# Patient Record
Sex: Female | Born: 1975 | Hispanic: No | Marital: Married | Smoking: Former smoker
Health system: Southern US, Community
[De-identification: ages and names within clinical notes are randomized; demographics above are authoritative.]

## PROBLEM LIST (undated history)

## (undated) DIAGNOSIS — E119 Type 2 diabetes mellitus without complications: Secondary | ICD-10-CM

## (undated) DIAGNOSIS — K219 Gastro-esophageal reflux disease without esophagitis: Secondary | ICD-10-CM

## (undated) DIAGNOSIS — G2581 Restless legs syndrome: Secondary | ICD-10-CM

## (undated) HISTORY — PX: ABDOMINAL HYSTERECTOMY: SHX81

## (undated) HISTORY — DX: Type 2 diabetes mellitus without complications: E11.9

## (undated) HISTORY — PX: CARDIAC CATHETERIZATION: SHX172

---

## 2004-07-07 ENCOUNTER — Ambulatory Visit: Payer: Self-pay | Admitting: Unknown Physician Specialty

## 2005-03-29 ENCOUNTER — Inpatient Hospital Stay: Payer: Self-pay | Admitting: General Surgery

## 2005-12-12 ENCOUNTER — Emergency Department: Payer: Self-pay | Admitting: Emergency Medicine

## 2006-03-21 ENCOUNTER — Emergency Department: Payer: Self-pay | Admitting: Unknown Physician Specialty

## 2006-03-23 ENCOUNTER — Emergency Department: Payer: Self-pay | Admitting: Emergency Medicine

## 2008-01-15 ENCOUNTER — Emergency Department: Payer: Self-pay | Admitting: Emergency Medicine

## 2008-02-02 ENCOUNTER — Emergency Department: Payer: Self-pay | Admitting: Emergency Medicine

## 2008-02-02 ENCOUNTER — Other Ambulatory Visit: Payer: Self-pay

## 2008-02-02 IMAGING — CT CT CHEST W/ CM
1 series · 15 of 33 positions shown, 19 images · IV contrast (APPLIED)
Comparison: none

REASON FOR EXAM: pain, chest into back into throat, tachycardia, eval for
PE
COMMENTS:

PROCEDURE:     CT  - CT CHEST (FOR PE) W  - [DATE]  [DATE]
RESULT:
HISTORY: Pain, tachycardia.

[Series 5: soft tissue · axial · 0.74mm/px · z∈[-342,-86]mm · 15 of 101 slices shown, 19 images]
[im 8/101  mediastinal]
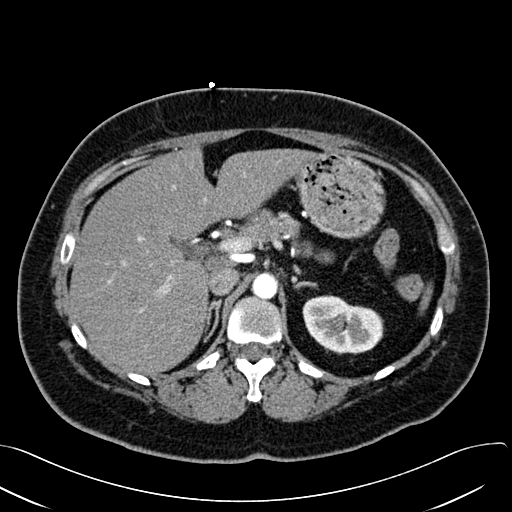
[im 8/101  lung]
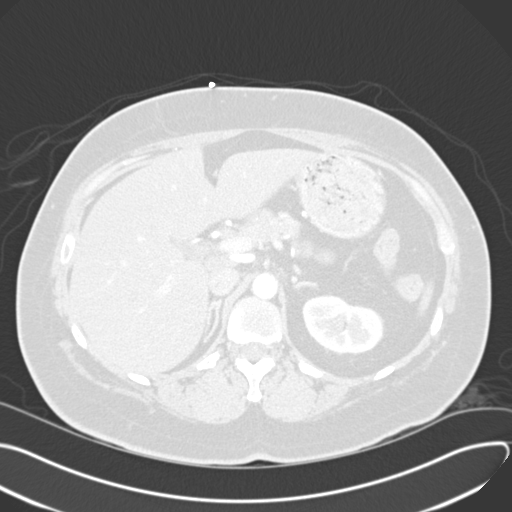
[im 15/101  lung]
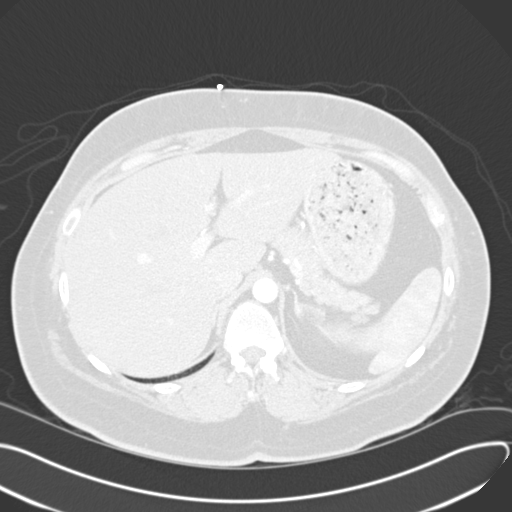
[im 21/101  lung]
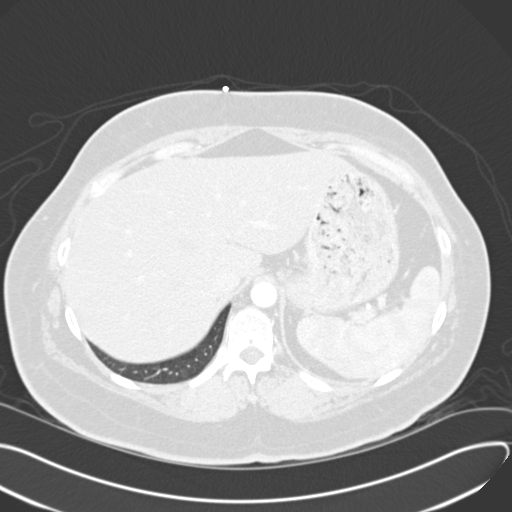
[im 26/101  lung]
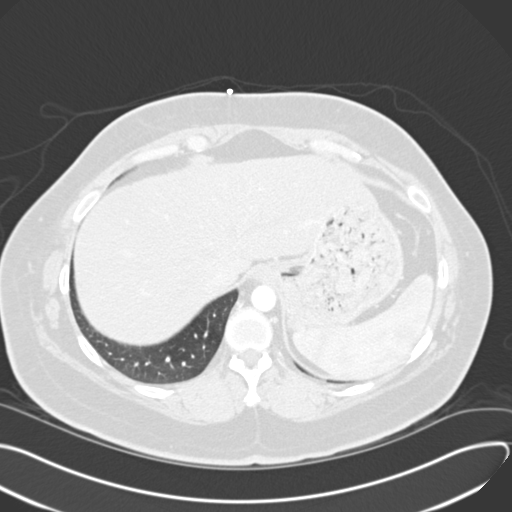
[im 34/101  mediastinal]
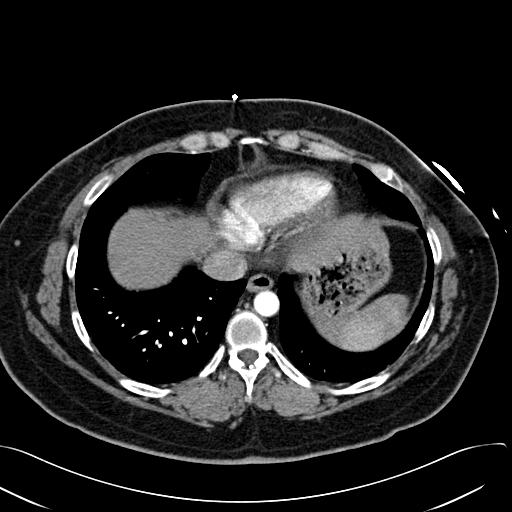
[im 34/101  lung]
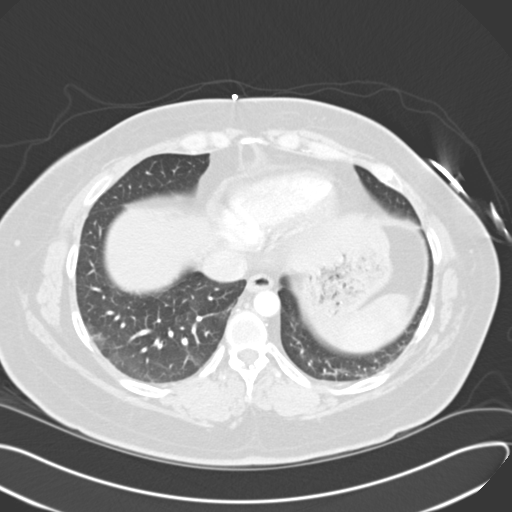
[im 41/101  lung]
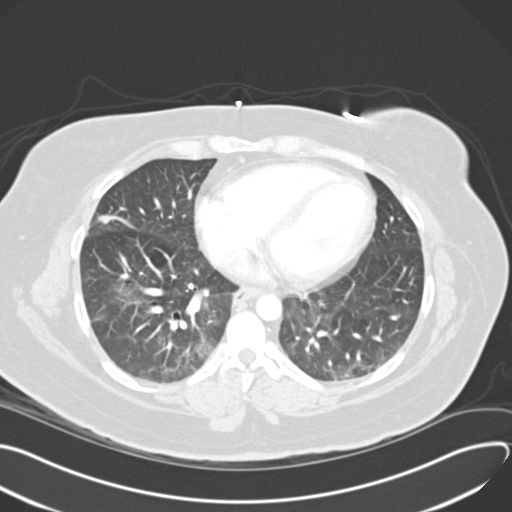
[im 48/101  lung]
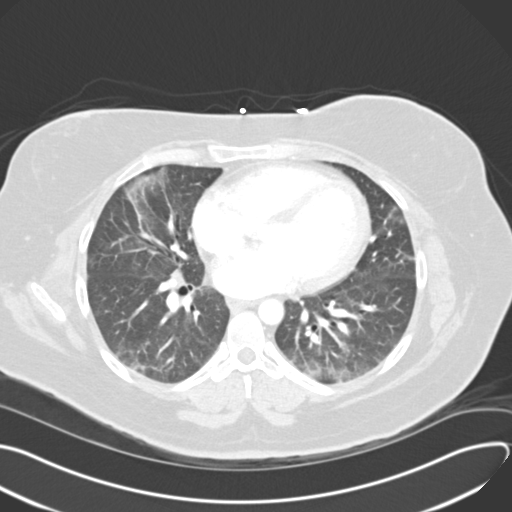
[im 52/101  lung]
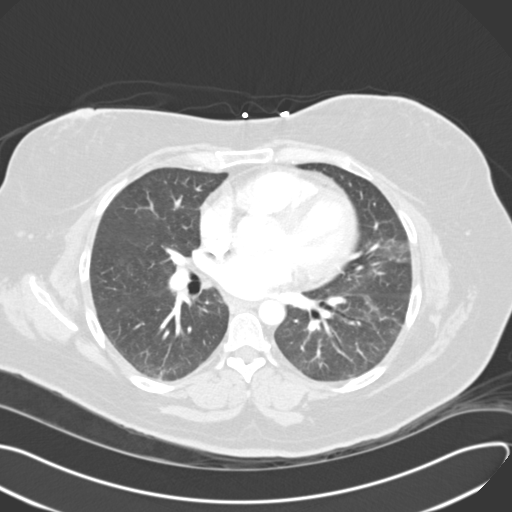
[im 56/101  mediastinal]
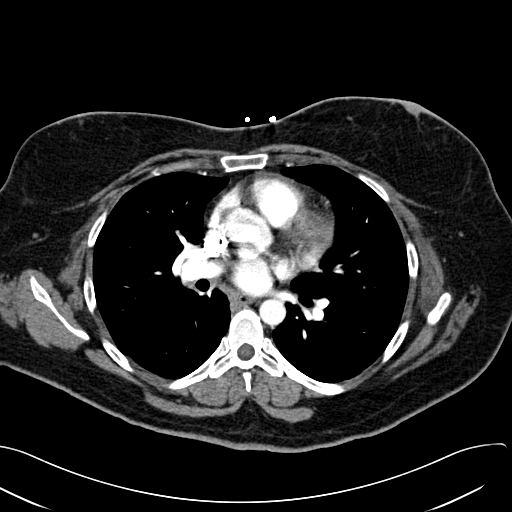
[im 56/101  lung]
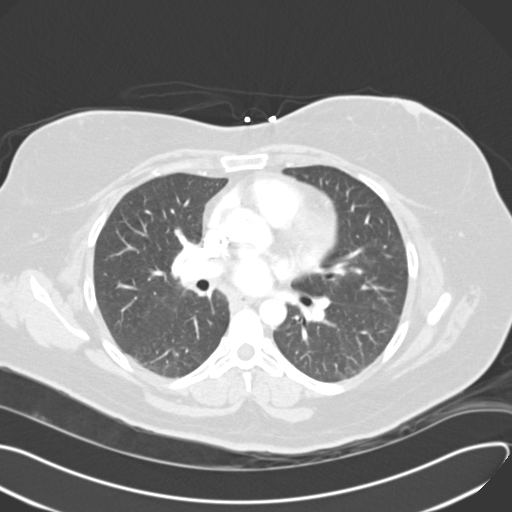
[im 61/101  lung]
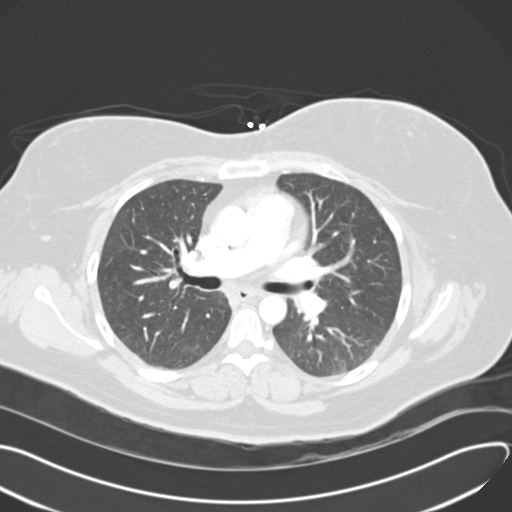
[im 67/101  lung]
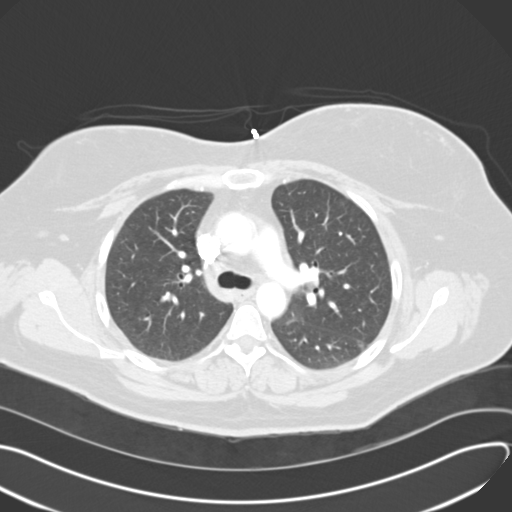
[im 75/101  lung]
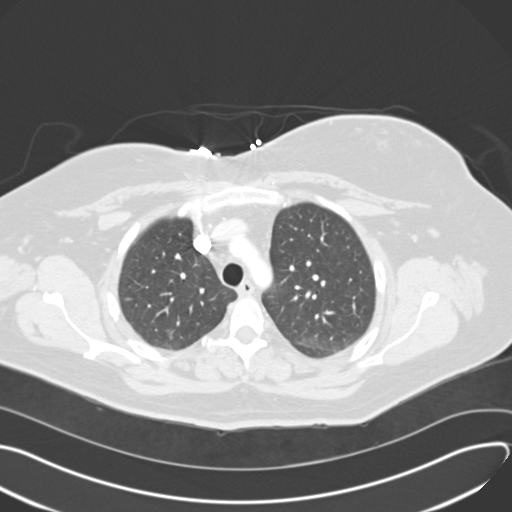
[im 81/101  mediastinal]
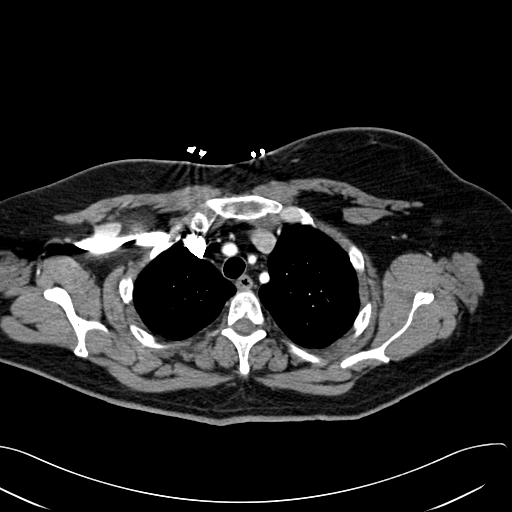
[im 81/101  lung]
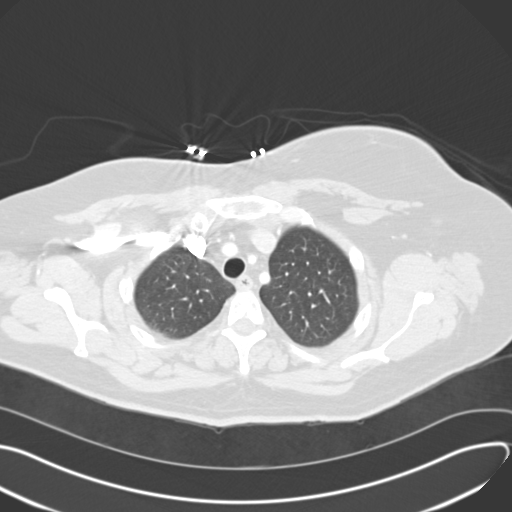
[im 86/101  lung]
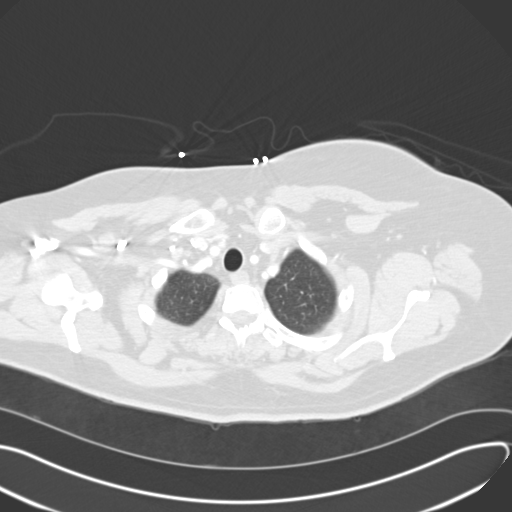
[im 93/101  lung]
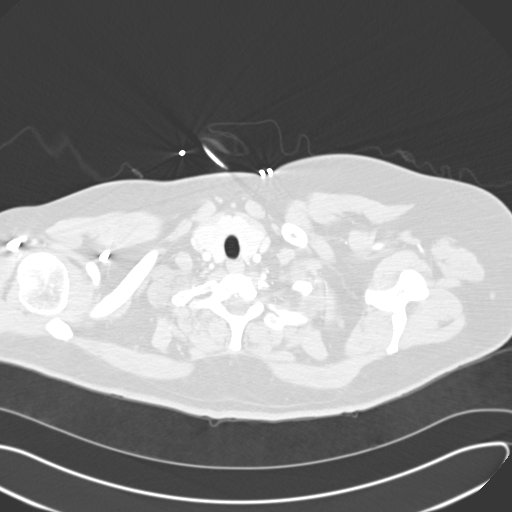

[15 of 33 positions shown; findings below may reference images not displayed]

COMPARISON STUDIES: No recent.

PROCEDURE AND FINDINGS:  Standard IV contrast enhanced Chest CT is obtained.
 The thoracic aorta is normal.  There is no evidence of pulmonary embolus.
Bilateral patchy pulmonary infiltrates are present. These can be seen with
pneumonia. There is no evidence of pleural effusion or cardiomegaly. Noted
is a pulmonary nodule measuring approximately 5 mm in the RIGHT upper lobe.
This is present on image #26.
IMPRESSION: 1.No pulmonary embolus.

2.RIGHT upper lobe pulmonary nodule measuring 5 mm. Close follow-up Chest
CTs are recommended to further evaluate. The nodule is not calcified and
follow-up images will be needed at 6 month intervals to ensure stability. If
there is interval change a PET CT may prove useful although at this time the
nodule is most likely too small for accurate evaluation by PET CT or by
percutaneous biopsy. Alternative would be surgical removal at this time.

3.Bibasilar infiltrates suggesting the possibility of bibasilar pneumonia or
other infiltrative process. Follow-up Chest CTs can be performed to
demonstrate clearing.

## 2008-10-08 ENCOUNTER — Emergency Department: Payer: Self-pay | Admitting: Emergency Medicine

## 2010-06-07 ENCOUNTER — Emergency Department: Payer: Self-pay | Admitting: Emergency Medicine

## 2014-07-29 ENCOUNTER — Ambulatory Visit: Payer: Self-pay | Admitting: Family Medicine

## 2015-06-02 ENCOUNTER — Encounter: Payer: Self-pay | Admitting: Emergency Medicine

## 2015-06-02 ENCOUNTER — Emergency Department
Admission: EM | Admit: 2015-06-02 | Discharge: 2015-06-02 | Disposition: A | Discharge status: Home / Self Care | Payer: 59 | Attending: Emergency Medicine | Admitting: Emergency Medicine

## 2015-06-02 ENCOUNTER — Emergency Department: Payer: 59

## 2015-06-02 DIAGNOSIS — Z880 Allergy status to penicillin: Secondary | ICD-10-CM | POA: Insufficient documentation

## 2015-06-02 DIAGNOSIS — K529 Noninfective gastroenteritis and colitis, unspecified: Secondary | ICD-10-CM | POA: Diagnosis not present

## 2015-06-02 DIAGNOSIS — Z87891 Personal history of nicotine dependence: Secondary | ICD-10-CM | POA: Insufficient documentation

## 2015-06-02 DIAGNOSIS — R197 Diarrhea, unspecified: Secondary | ICD-10-CM | POA: Diagnosis present

## 2015-06-02 HISTORY — DX: Gastro-esophageal reflux disease without esophagitis: K21.9

## 2015-06-02 HISTORY — DX: Restless legs syndrome: G25.81

## 2015-06-02 LAB — COMPREHENSIVE METABOLIC PANEL
ALBUMIN: 4.3 g/dL (ref 3.5–5.0)
ALT: 31 U/L (ref 14–54)
AST: 25 U/L (ref 15–41)
Alkaline Phosphatase: 68 U/L (ref 38–126)
Anion gap: 9 (ref 5–15)
BILIRUBIN TOTAL: 0.2 mg/dL — AB (ref 0.3–1.2)
BUN: 15 mg/dL (ref 6–20)
CO2: 27 mmol/L (ref 22–32)
Calcium: 9.1 mg/dL (ref 8.9–10.3)
Chloride: 102 mmol/L (ref 101–111)
Creatinine, Ser: 0.71 mg/dL (ref 0.44–1.00)
GFR calc Af Amer: >60 mL/min (ref >60)
GFR calc non Af Amer: >60 mL/min (ref >60)
GLUCOSE: 131 mg/dL — AB (ref 65–99)
POTASSIUM: 3.8 mmol/L (ref 3.5–5.1)
Sodium: 138 mmol/L (ref 135–145)
TOTAL PROTEIN: 8.5 g/dL — AB (ref 6.5–8.1)

## 2015-06-02 LAB — LIPASE, BLOOD: Lipase: 26 U/L (ref 22–51)

## 2015-06-02 LAB — CBC
HEMATOCRIT: 45.3 % (ref 35.0–47.0)
Hemoglobin: 15 g/dL (ref 12.0–16.0)
MCH: 28.8 pg (ref 26.0–34.0)
MCHC: 33 g/dL (ref 32.0–36.0)
MCV: 87.1 fL (ref 80.0–100.0)
Platelets: 312 10*3/uL (ref 150–440)
RBC: 5.2 MIL/uL (ref 3.80–5.20)
RDW: 13.8 % (ref 11.5–14.5)
WBC: 13.8 10*3/uL — ABNORMAL HIGH (ref 3.6–11.0)

## 2015-06-02 MED ORDER — ONDANSETRON 4 MG PO TBDP
ORAL_TABLET | ORAL | Status: AC
  Filled 2015-06-02: qty 1

## 2015-06-02 MED ORDER — ONDANSETRON 4 MG PO TBDP
4.0000 mg | ORAL_TABLET | Freq: Once | ORAL | Status: AC | PRN
  Administered 2015-06-02: 4 mg via ORAL

## 2015-06-02 MED ORDER — DICYCLOMINE HCL 20 MG PO TABS: 9.0000 mg | ORAL_TABLET | Freq: Three times a day (TID) | ORAL | Status: DC | PRN

## 2015-06-02 MED ORDER — ONDANSETRON HCL 4 MG PO TABS: 4.0000 mg | ORAL_TABLET | Freq: Every day | ORAL | Status: DC | PRN

## 2015-06-02 NOTE — ED Provider Notes (Signed)
[facility] Emergency Department Provider Note     Time seen: ----------------------------------------- 3:37 PM on 06/02/2015 -----------------------------------------    I have reviewed the triage vital signs and the nursing notes.   HISTORY  Chief Complaint Abdominal Pain; Emesis; and Diarrhea    HPI Kimberly Warner is a 39 y.o. female who presents ER with epigastric pain, nausea vomiting diarrhea. Patient states symptoms started this morning after eating a biscuit from [employer]. She does have a history of acid reflux and currently is on Dexilant for this. She denies any fever or chills, mostly complaining of abdominal pain with vomiting and diarrhea.   Past Medical History  Diagnosis Date  . Acid reflux   . Restless leg     There are no active problems to display for this patient.   Past Surgical History  Procedure Laterality Date  . Abdominal hysterectomy      Allergies Celebrex; Sulfa antibiotics; and Penicillins  Social History Social History  Substance Use Topics  . Smoking status: Former Games developer  . Smokeless tobacco: None  . Alcohol Use: No    Review of Systems Constitutional: Negative for fever. Eyes: Negative for visual changes. ENT: Negative for sore throat. Cardiovascular: Negative for chest pain. Respiratory: Negative for shortness of breath. Gastrointestinal: Positive for abdominal pain, vomiting and diarrhea Genitourinary: Negative for dysuria. Musculoskeletal: Negative for back pain. Skin: Negative for rash. Neurological: Negative for headaches, focal weakness or numbness.  10-point ROS otherwise negative.  ____________________________________________   PHYSICAL EXAM:  VITAL SIGNS: ED Triage Vitals  Enc Vitals Group     BP 06/02/15 1508 130/75 mmHg     Pulse Rate 06/02/15 1508 106     Resp 06/02/15 1508 18     Temp 06/02/15 1508 98.2 F (36.8 C)     Temp Source 06/02/15 1508 Oral     SpO2 06/02/15  1508 100 %     Weight 06/02/15 1508 188 lb (85.276 kg)     Height 06/02/15 1508  (1.626 m)     Head Cir --      Peak Flow --      Pain Score 06/02/15 1509 4     Pain Loc --      Pain Edu? --      Excl. in GC? --     Constitutional: Alert and oriented. Well appearing and in no distress. Eyes: Conjunctivae are normal. PERRL. Normal extraocular movements. ENT   Head: Normocephalic and atraumatic.   Nose: No congestion/rhinnorhea.   Mouth/Throat: Mucous membranes are moist.   Neck: No stridor. Cardiovascular: Normal rate, regular rhythm. Normal and symmetric distal pulses are present in all extremities. No murmurs, rubs, or gallops. Respiratory: Normal respiratory effort without tachypnea nor retractions. Breath sounds are clear and equal bilaterally. No wheezes/rales/rhonchi. Gastrointestinal: Mild epigastric tenderness, no rebound or guarding. Normal bowel sounds Musculoskeletal: Nontender with normal range of motion in all extremities. No joint effusions.  No lower extremity tenderness nor edema. Neurologic:  Normal speech and language. No gross focal neurologic deficits are appreciated. Speech is normal. No gait instability. Skin:  Skin is warm, dry and intact. No rash noted. Psychiatric: Mood and affect are normal. Speech and behavior are normal. Patient exhibits appropriate insight and judgment.   ____________________________________________  ED COURSE:  Pertinent labs & imaging results that were available during my care of the patient were reviewed by me and considered in my medical decision making (see chart for details). Patient looks well, likely gastroenteritis. _____________________________________  Abdomen 2 view  IMPRESSION: Benign appearing abdomen.     LABS (pertinent positives/negatives)  Labs Reviewed  COMPREHENSIVE METABOLIC PANEL - Abnormal; Notable for the following:    Glucose, Bld 131 (*)    Total Protein 8.5 (*)    Total Bilirubin  0.2 (*)    All other components within normal limits  CBC - Abnormal; Notable for the following:    WBC 13.8 (*)    All other components within normal limits  LIPASE, BLOOD  URINALYSIS COMPLETEWITH MICROSCOPIC ([facility] ONLY)   ____________________________________________  FINAL ASSESSMENT AND PLAN  Gastroenteritis  Plan: Patient with labs and imaging as dictated above. Patient with mild leukocytosis but benign abdomen. She is able to tolerate liquids here. She'll be discharged with Zofran, encouraged to continue taking Dexilant. We'll also prescribe Arna Medici, MD   Emily Filbert, MD 06/02/15 [phone]

## 2015-06-02 NOTE — ED Notes (Signed)
C/o epigastric abd. Pain that radiates through to back with n,v,&d since this am

## 2015-06-02 NOTE — Discharge Instructions (Signed)

## 2016-04-03 ENCOUNTER — Other Ambulatory Visit: Payer: Self-pay | Admitting: Pulmonary Disease

## 2016-04-03 DIAGNOSIS — M5441 Lumbago with sciatica, right side: Secondary | ICD-10-CM

## 2019-12-08 ENCOUNTER — Other Ambulatory Visit: Payer: Self-pay

## 2019-12-08 ENCOUNTER — Encounter: Payer: Self-pay | Admitting: Cardiology

## 2019-12-08 ENCOUNTER — Ambulatory Visit (INDEPENDENT_AMBULATORY_CARE_PROVIDER_SITE_OTHER): Payer: BC Managed Care – PPO

## 2019-12-08 ENCOUNTER — Ambulatory Visit (INDEPENDENT_AMBULATORY_CARE_PROVIDER_SITE_OTHER): Payer: BC Managed Care – PPO | Admitting: Cardiology

## 2019-12-08 VITALS — BP 126/88 | HR 91 | Ht 64.0 in | Wt 174.1 lb

## 2019-12-08 DIAGNOSIS — R002 Palpitations: Secondary | ICD-10-CM

## 2019-12-08 DIAGNOSIS — R079 Chest pain, unspecified: Secondary | ICD-10-CM | POA: Diagnosis not present

## 2019-12-08 DIAGNOSIS — R011 Cardiac murmur, unspecified: Secondary | ICD-10-CM

## 2019-12-08 NOTE — Progress Notes (Signed)
Cardiology Office Note:    Date:  12/08/2019   ID:  Kimberly Warner, DOB [birth year], MRN [id number]  PCP:  Herminio Commons, MD  Cardiologist:  Kate Sable, MD  Electrophysiologist:  None   Referring MD: Gennette Pac, FNP   Chief Complaint  Patient presents with  . New Patient (Initial Visit)    referred by PCPfor hx of myocardial infarction, intermittent palpitations, post COVID complications. Meds reviewed vebrally with patient.    Kimberly Warner is a 44 y.o. female who is being seen today for the evaluation of palpitations, chest pain at the request of Gennette Pac, [place].   History of Present Illness:    Kimberly Warner is a 44 y.o. female with a hx diabetes, former smoker x 20years who presents due to palpitations and chest pain.  Patient was diagnosed with COVID-19 infection in December 2020.  She had symptoms of headache and muscle aches.  Since then, she has noticed symptoms of heart heart fluttering, typically occurring every day, lasting 10 to 15 seconds.  She denies shortness of breath, dizziness, syncope with symptoms.  She also endorses chest pain which typically occurs when she exerts herself such as going up the hill in her driveway when pulling a trash.  She describes chest discomfort as pressure which she rates as a 4 out of 10 in severity, sometimes radiating to her shoulders.  She has a family history of MI in her father and mother.  Both parents had MIs in their 57s.  Past Medical History:  Diagnosis Date  . Acid reflux   . Diabetes mellitus without complication ([place])   . Restless leg     Past Surgical History:  Procedure Laterality Date  . ABDOMINAL HYSTERECTOMY      Current Medications: Current Meds  Medication Sig  . amitriptyline (ELAVIL) 50 MG tablet Take 50 mg by mouth at bedtime.  Marland Kitchen DEXILANT 60 MG capsule Take 1 capsule by mouth daily.  Marland Kitchen gabapentin (NEURONTIN) 300 MG capsule Take 1,200 mg by mouth daily.  . metFORMIN  (GLUCOPHAGE) 1000 MG tablet Take 1,000 mg by mouth daily with breakfast.     Allergies:   Celebrex [celecoxib], Sulfa antibiotics, and Penicillins   Social History   Socioeconomic History  . Marital status: Single    Spouse name: Not on file  . Number of children: Not on file  . Years of education: Not on file  . Highest education level: Not on file  Occupational History  . Not on file  Tobacco Use  . Smoking status: Former Research scientist (life sciences)  . Smokeless tobacco: Never Used  Substance and Sexual Activity  . Alcohol use: No  . Drug use: No  . Sexual activity: Not on file  Other Topics Concern  . Not on file  Social History Narrative  . Not on file   Social Determinants of Health   Financial Resource Strain:   . Difficulty of Paying Living Expenses:   Food Insecurity:   . Worried About Charity fundraiser in the Last Year:   . Arboriculturist in the Last Year:   Transportation Needs:   . Film/video editor (Medical):   Marland Kitchen Lack of Transportation (Non-Medical):   Physical Activity:   . Days of Exercise per Week:   . Minutes of Exercise per Session:   Stress:   . Feeling of Stress :   Social Connections:   . Frequency of Communication with Friends and Family:   . Frequency  of Social Gatherings with Friends and Family:   . Attends Religious Services:   . Active Member of Clubs or Organizations:   . Attends Banker Meetings:   Marland Kitchen Marital Status:      Family History: The patient's family history is not on file.  ROS:   Please see the history of present illness.     All other systems reviewed and are negative.  EKGs/Labs/Other Studies Reviewed:    The following studies were reviewed today:   EKG:  EKG is  ordered today.  The ekg ordered today demonstrates normal sinus rhythm, low voltage QRS.  Recent Labs: No results found for requested labs within last 8760 hours.  Recent Lipid Panel No results found for: CHOL, TRIG, HDL, CHOLHDL, VLDL, LDLCALC,  LDLDIRECT  Physical Exam:    VS:  BP 126/88 (BP Location: Right Arm, Patient Position: Sitting, Cuff Size: Normal)   Pulse 91   Ht 5\' 4"  (1.626 m)   Wt 174 lb 2 oz (79 kg)   SpO2 91%   BMI 29.89 kg/m     Wt Readings from Last 3 Encounters:  12/08/19 174 lb 2 oz (79 kg)  06/02/15 188 lb (85.3 kg)     GEN:  Well nourished, well developed in no acute distress HEENT: Normal NECK: No JVD; No carotid bruits LYMPHATICS: No lymphadenopathy CARDIAC: RRR, 1/6 systolic murmur noted at the right upper sternal border.  RESPIRATORY:  Clear to auscultation without rales, wheezing or rhonchi  ABDOMEN: Soft, non-tender, non-distended MUSCULOSKELETAL:  No edema; No deformity  SKIN: Warm and dry NEUROLOGIC:  Alert and oriented x 3 PSYCHIATRIC:  Normal affect   ASSESSMENT:    1. Chest pain of uncertain etiology   2. Fluttering sensation of heart   3. Heart murmur    PLAN:    In order of problems listed above:  1. The patient describes occasional angina-like chest pain.  Especially when going uphill and doing her trash.  She has risk factors of diabetes, former smoker for over 20 years, early family history of CAD in both parents.  We will get a Lexiscan myocardial perfusion imaging stress test. 2. Patient with palpitations/fluttering sensation in her heart.  Will evaluate with a cardiac monitor x2 weeks. 3. Systolic heart murmur heard on exam.  Get echocardiogram to evaluate any valvular pathology.  Follow-up after above testing.   Medication Adjustments/Labs and Tests Ordered: Current medicines are reviewed at length with the patient today.  Concerns regarding medicines are outlined above.  Orders Placed This Encounter  Procedures  . NM Myocar Multi W/Spect W/Wall Motion / EF  . LONG TERM MONITOR (3-14 DAYS)  . EKG 12-Lead  . ECHOCARDIOGRAM COMPLETE   No orders of the defined types were placed in this encounter.   Patient Instructions  Medication Instructions:  Your physician  recommends that you continue on your current medications as directed. Please refer to the Current Medication list given to you today.  *If you need a refill on your cardiac medications before your next appointment, please call your pharmacy*  Testing/Procedures: 1- Your physician has requested that you have an echocardiogram. Echocardiography is a painless test that uses sound waves to create images of your heart. It provides your doctor with information about the size and shape of your heart and how well your heart's chambers and valves are working. This procedure takes approximately one hour. There are no restrictions for this procedure. You may get an IV, if needed, to receive an  ultrasound enhancing agent through to better visualize your heart.   2- Your physician has requested that you have a lexiscan myoview. For further information please visit [email or web address]. Please follow instruction sheet, as given. [facility] MYOVIEW  Your caregiver has ordered a Stress Test with nuclear imaging. The purpose of this test is to evaluate the blood supply to your heart muscle. This procedure is referred to as a "Non-Invasive Stress Test." This is because other than having an IV started in your vein, nothing is inserted or "invades" your body. Cardiac stress tests are done to find areas of poor blood flow to the heart by determining the extent of coronary artery disease (CAD). Some patients exercise on a treadmill, which naturally increases the blood flow to your heart, while others who are  unable to walk on a treadmill due to physical limitations have a pharmacologic/chemical stress agent called Lexiscan . This medicine will mimic walking on a treadmill by temporarily increasing your coronary blood flow.   Please note: these test may take anywhere between 2-4 hours to complete  PLE[facility] REPORT [facility]  THE VOLUNTEERS AT THE FIRST DESK WILL DIRECT YOU WHERE TO GO  Date of  Procedure:_____________________________________  Arrival Time for Procedure:______________________________  Instructions regarding medication:   _xx_ : Hold diabetes medication morning of procedure - metformin  PLEASE NOTIFY THE OFFICE AT LEAST 24 HOURS IN ADVANCE IF YOU ARE UNABLE TO KEEP YOUR APPOINTMENT.  [phone] AND  PLEASE NOTIFY [facility] AT [facility] AT LEAST 24 HOURS IN ADVANCE IF YOU ARE UNABLE TO KEEP YOUR APPOINTMENT. [phone]  How to prepare for your Myoview test:  1. Do not eat or drink after midnight 2. No caffeine for 24 hours prior to test 3. No smoking 24 hours prior to test. 4. Your medication may be taken with water.  If your doctor stopped a medication because of this test, do not take that medication. 5. Ladies, please do not wear dresses.  Skirts or pants are appropriate. Please wear a short sleeve shirt. 6. No perfume, cologne or lotion. 7. Wear comfortable walking shoes. No heels!  3- Your physician has recommended that you wear a 14 day Zio monitor. This monitor is a medical device that records the heart's electrical activity. Doctors most often use these monitors to diagnose arrhythmias. Arrhythmias are problems with the speed or rhythm of the heartbeat. The monitor is a small device applied to your chest. You can wear one while you do your normal daily activities. While wearing this monitor if you have any symptoms to push the button and record what you felt. Once you have worn this monitor for the period of time provider prescribed (Usually 14 days), you will return the monitor device in the postage paid box. Once it is returned they will download the data collected and provide us with a report which the provider will then review and we will call you with those results. Important tips:  1. Avoid showering during the first 24 hours of wearing the monitor. 2. Avoid excessive sweating to help maximize wear time. 3. Do not submerge the device, no hot tubs,  and no swimming pools. 4. Keep any lotions or oils away from the patch. 5. After 24 hours you may shower with the patch on. Take brief showers with your back facing the shower head.  6. Do not remove patch once it has been placed because that will interrupt data and decrease adhesive wear time. 7. Push the button when  you have any symptoms and write down what you were feeling. 8. Once you have completed wearing your monitor, remove and place into box which has postage paid and place in your outgoing mailbox.  9. If for some reason you have misplaced your box then call our office and we can provide another box and/or mail it off for you.   Follow-Up: At [facility], you and your health needs are our priority.  As part of our continuing mission to provide you with exceptional heart care, we have created designated Provider Care Teams.  These Care Teams include your primary Cardiologist (physician) and Advanced Practice Providers (APPs -  Physician Assistants and Nurse Practitioners) who all work together to provide you with the care you need, when you need it.  We recommend signing up for the patient portal called "MyChart".  Sign up information is provided on this After Visit Summary.  MyChart is used to connect with patients for Virtual Visits (Telemedicine).  Patients are able to view lab/test results, encounter notes, upcoming appointments, etc.  Non-urgent messages can be sent to your provider as well.   To learn more about what you can do with MyChart, go to [email or web address].    Your next appointment:   After testing.  The format for your next appointment:   In Person  Provider:   Debbe Odea, MD    Cardiac Nuclear Scan A cardiac nuclear scan is a test that measures blood flow to the heart when a person is resting and when he or she is exercising. The test looks for problems such as:  Not enough blood reaching a portion of the heart.  The heart muscle not working  normally. You may need this test if:  You have heart disease.  You have had abnormal lab results.  You have had heart surgery or a balloon procedure to open up blocked arteries (angioplasty).  You have chest pain.  You have shortness of breath. In this test, a radioactive dye (tracer) is injected into your bloodstream. After the tracer has traveled to your heart, an imaging device is used to measure how much of the tracer is absorbed by or distributed to various areas of your heart. This procedure is usually done at a hospital and takes 2-4 hours. Tell a health care provider about:  Any allergies you have.  All medicines you are taking, including vitamins, herbs, eye drops, creams, and over-the-counter medicines.  Any problems you or family members have had with anesthetic medicines.  Any blood disorders you have.  Any surgeries you have had.  Any medical conditions you have.  Whether you are pregnant or may be pregnant. What are the risks? Generally, this is a safe procedure. However, problems may occur, including:  Serious chest pain and heart attack. This is only a risk if the stress portion of the test is done.  Rapid heartbeat.  Sensation of warmth in your chest. This usually passes quickly.  Allergic reaction to the tracer. What happens before the procedure?  Ask your health care provider about changing or stopping your regular medicines. This is especially important if you are taking diabetes medicines or blood thinners.  Follow instructions from your health care provider about eating or drinking restrictions.  Remove your jewelry on the day of the procedure. What happens during the procedure?  An IV will be inserted into one of your veins.  Your health care provider will inject a small amount of radioactive tracer through the IV.  You  will wait for 20-40 minutes while the tracer travels through your bloodstream.  Your heart activity will be monitored with  an electrocardiogram (ECG).  You will lie down on an exam table.  Images of your heart will be taken for about 15-20 minutes.  You may also have a stress test. For this test, one of the following may be done: ? You will exercise on a treadmill or stationary bike. While you exercise, your heart's activity will be monitored with an ECG, and your blood pressure will be checked. ? You will be given medicines that will increase blood flow to parts of your heart. This is done if you are unable to exercise.  When blood flow to your heart has peaked, a tracer will again be injected through the IV.  After 20-40 minutes, you will get back on the exam table and have more images taken of your heart.  Depending on the type of tracer used, scans may need to be repeated 3-4 hours later.  Your IV line will be removed when the procedure is over. The procedure may vary among health care providers and hospitals. What happens after the procedure?  Unless your health care provider tells you otherwise, you may return to your normal schedule, including diet, activities, and medicines.  Unless your health care provider tells you otherwise, you may increase your fluid intake. This will help to flush the contrast dye from your body. Drink enough fluid to keep your urine pale yellow.  Ask your health care provider, or the department that is doing the test: ? When will my results be ready? ? How will I get my results? Summary  A cardiac nuclear scan measures the blood flow to the heart when a person is resting and when he or she is exercising.  Tell your health care provider if you are pregnant.  Before the procedure, ask your health care provider about changing or stopping your regular medicines. This is especially important if you are taking diabetes medicines or blood thinners.  After the procedure, unless your health care provider tells you otherwise, increase your fluid intake. This will help flush the  contrast dye from your body.  After the procedure, unless your health care provider tells you otherwise, you may return to your normal schedule, including diet, activities, and medicines. This information is not intended to replace advice given to you by your health care provider. Make sure you discuss any questions you have with your health care provider. Document Revised: 02/25/2018 Document Reviewed: 02/25/2018 Elsevier Patient Education  2020 [employer].  Echocardiogram An echocardiogram is a procedure that uses painless sound waves (ultrasound) to produce an image of the heart. Images from an echocardiogram can provide important information about:  Signs of coronary artery disease (CAD).  Aneurysm detection. An aneurysm is a weak or damaged part of an artery wall that bulges out from the normal force of blood pumping through the body.  Heart size and shape. Changes in the size or shape of the heart can be associated with certain conditions, including heart failure, aneurysm, and CAD.  Heart muscle function.  Heart valve function.  Signs of a past heart attack.  Fluid buildup around the heart.  Thickening of the heart muscle.  A tumor or infectious growth around the heart valves. Tell a health care provider about:  Any allergies you have.  All medicines you are taking, including vitamins, herbs, eye drops, creams, and over-the-counter medicines.  Any blood disorders you have.  Any surgeries you have had.  Any medical conditions you have.  Whether you are pregnant or may be pregnant. What are the risks? Generally, this is a safe procedure. However, problems may occur, including:  Allergic reaction to dye (contrast) that may be used during the procedure. What happens before the procedure? No specific preparation is needed. You may eat and drink normally. What happens during the procedure?   An IV tube may be inserted into one of your veins.  You may receive  contrast through this tube. A contrast is an injection that improves the quality of the pictures from your heart.  A gel will be applied to your chest.  A wand-like tool (transducer) will be moved over your chest. The gel will help to transmit the sound waves from the transducer.  The sound waves will harmlessly bounce off of your heart to allow the heart images to be captured in real-time motion. The images will be recorded on a computer. The procedure may vary among health care providers and hospitals. What happens after the procedure?  You may return to your normal, everyday life, including diet, activities, and medicines, unless your health care provider tells you not to do that. Summary  An echocardiogram is a procedure that uses painless sound waves (ultrasound) to produce an image of the heart.  Images from an echocardiogram can provide important information about the size and shape of your heart, heart muscle function, heart valve function, and fluid buildup around your heart.  You do not need to do anything to prepare before this procedure. You may eat and drink normally.  After the echocardiogram is completed, you may return to your normal, everyday life, unless your health care provider tells you not to do that. This information is not intended to replace advice given to you by your health care provider. Make sure you discuss any questions you have with your health care provider. Document Revised: 01/02/2019 Document Reviewed: 10/14/2016 Elsevier Patient Education  2020 [employer].     Signed, Debbe Odea, MD  12/08/2019 12:23 PM    [facility]

## 2019-12-08 NOTE — Patient Instructions (Signed)
Medication Instructions:  Your physician recommends that you continue on your current medications as directed. Please refer to the Current Medication list given to you today.  *If you need a refill on your cardiac medications before your next appointment, please call your pharmacy*  Testing/Procedures: 1- Your physician has requested that you have an echocardiogram. Echocardiography is a painless test that uses sound waves to create images of your heart. It provides your doctor with information about the size and shape of your heart and how well your heart's chambers and valves are working. This procedure takes approximately one hour. There are no restrictions for this procedure. You may get an IV, if needed, to receive an ultrasound enhancing agent through to better visualize your heart.   2- Your physician has requested that you have a lexiscan myoview. For further information please visit [email or web address] Please follow instruction sheet, as given. [facility] MYOVIEW  Your caregiver has ordered a Stress Test with nuclear imaging. The purpose of this test is to evaluate the blood supply to your heart muscle. This procedure is referred to as a "Non-Invasive Stress Test." This is because other than having an IV started in your vein, nothing is inserted or "invades" your body. Cardiac stress tests are done to find areas of poor blood flow to the heart by determining the extent of coronary artery disease (CAD). Some patients exercise on a treadmill, which naturally increases the blood flow to your heart, while others who are  unable to walk on a treadmill due to physical limitations have a pharmacologic/chemical stress agent called Lexiscan . This medicine will mimic walking on a treadmill by temporarily increasing your coronary blood flow.   Please note: these test may take anywhere between 2-4 hours to complete  PLEASE REPORT TO [facility] MEDICAL MALL ENTRANCE  THE VOLUNTEERS AT THE FIRST DESK WILL DIRECT  YOU WHERE TO GO  Date of Procedure:_____________________________________  Arrival Time for Procedure:______________________________  Instructions regarding medication:   _xx_ : Hold diabetes medication morning of procedure - metformin  PLEASE NOTIFY THE OFFICE AT LEAST 24 HOURS IN ADVANCE IF YOU ARE UNABLE TO KEEP YOUR APPOINTMENT.  [phone] AND  PLEASE NOTIFY NUCLEAR MEDICINE AT [facility] AT LEAST 24 HOURS IN ADVANCE IF YOU ARE UNABLE TO KEEP YOUR APPOINTMENT. [phone]  How to prepare for your Myoview test:  1. Do not eat or drink after midnight 2. No caffeine for 24 hours prior to test 3. No smoking 24 hours prior to test. 4. Your medication may be taken with water.  If your doctor stopped a medication because of this test, do not take that medication. 5. Ladies, please do not wear dresses.  Skirts or pants are appropriate. Please wear a short sleeve shirt. 6. No perfume, cologne or lotion. 7. Wear comfortable walking shoes. No heels!  3- Your physician has recommended that you wear a 14 day Zio monitor. This monitor is a medical device that records the heart's electrical activity. Doctors most often use these monitors to diagnose arrhythmias. Arrhythmias are problems with the speed or rhythm of the heartbeat. The monitor is a small device applied to your chest. You can wear one while you do your normal daily activities. While wearing this monitor if you have any symptoms to push the button and record what you felt. Once you have worn this monitor for the period of time provider prescribed (Usually 14 days), you will return the monitor device in the postage paid box. Once it is returned they will download  the data collected and provide [place] with a report which the provider will then review and we will call you with those results. Important tips:  1. Avoid showering during the first 24 hours of wearing the monitor. 2. Avoid excessive sweating to help maximize wear time. 3. Do not  submerge the device, no hot tubs, and no swimming pools. 4. Keep any lotions or oils away from the patch. 5. After 24 hours you may shower with the patch on. Take brief showers with your back facing the shower head.  6. Do not remove patch once it has been placed because that will interrupt data and decrease adhesive wear time. 7. Push the button when you have any symptoms and write down what you were feeling. 8. Once you have completed wearing your monitor, remove and place into box which has postage paid and place in your outgoing mailbox.  9. If for some reason you have misplaced your box then call our office and we can provide another box and/or mail it off for you.   Follow-Up: At [facility], you and your health needs are our priority.  As part of our continuing mission to provide you with exceptional heart care, we have created designated Provider Care Teams.  These Care Teams include your primary Cardiologist (physician) and Advanced Practice Providers (APPs -  Physician Assistants and Nurse Practitioners) who all work together to provide you with the care you need, when you need it.  We recommend signing up for the patient portal called "MyChart".  Sign up information is provided on this After Visit Summary.  MyChart is used to connect with patients for Virtual Visits (Telemedicine).  Patients are able to view lab/test results, encounter notes, upcoming appointments, etc.  Non-urgent messages can be sent to your provider as well.   To learn more about what you can do with MyChart, go to [email or web address].    Your next appointment:   After testing.  The format for your next appointment:   In Person  Provider:   Kate Sable, MD    Cardiac Nuclear Scan A cardiac nuclear scan is a test that measures blood flow to the heart when a person is resting and when he or she is exercising. The test looks for problems such as:  Not enough blood reaching a portion of the heart.   The heart muscle not working normally. You may need this test if:  You have heart disease.  You have had abnormal lab results.  You have had heart surgery or a balloon procedure to open up blocked arteries (angioplasty).  You have chest pain.  You have shortness of breath. In this test, a radioactive dye (tracer) is injected into your bloodstream. After the tracer has traveled to your heart, an imaging device is used to measure how much of the tracer is absorbed by or distributed to various areas of your heart. This procedure is usually done at a hospital and takes 2-4 hours. Tell a health care provider about:  Any allergies you have.  All medicines you are taking, including vitamins, herbs, eye drops, creams, and over-the-counter medicines.  Any problems you or family members have had with anesthetic medicines.  Any blood disorders you have.  Any surgeries you have had.  Any medical conditions you have.  Whether you are pregnant or may be pregnant. What are the risks? Generally, this is a safe procedure. However, problems may occur, including:  Serious chest pain and heart attack. This is only a  risk if the stress portion of the test is done.  Rapid heartbeat.  Sensation of warmth in your chest. This usually passes quickly.  Allergic reaction to the tracer. What happens before the procedure?  Ask your health care provider about changing or stopping your regular medicines. This is especially important if you are taking diabetes medicines or blood thinners.  Follow instructions from your health care provider about eating or drinking restrictions.  Remove your jewelry on the day of the procedure. What happens during the procedure?  An IV will be inserted into one of your veins.  Your health care provider will inject a small amount of radioactive tracer through the IV.  You will wait for 20-40 minutes while the tracer travels through your bloodstream.  Your heart  activity will be monitored with an electrocardiogram (ECG).  You will lie down on an exam table.  Images of your heart will be taken for about 15-20 minutes.  You may also have a stress test. For this test, one of the following may be done: ? You will exercise on a treadmill or stationary bike. While you exercise, your heart's activity will be monitored with an ECG, and your blood pressure will be checked. ? You will be given medicines that will increase blood flow to parts of your heart. This is done if you are unable to exercise.  When blood flow to your heart has peaked, a tracer will again be injected through the IV.  After 20-40 minutes, you will get back on the exam table and have more images taken of your heart.  Depending on the type of tracer used, scans may need to be repeated 3-4 hours later.  Your IV line will be removed when the procedure is over. The procedure may vary among health care providers and hospitals. What happens after the procedure?  Unless your health care provider tells you otherwise, you may return to your normal schedule, including diet, activities, and medicines.  Unless your health care provider tells you otherwise, you may increase your fluid intake. This will help to flush the contrast dye from your body. Drink enough fluid to keep your urine pale yellow.  Ask your health care provider, or the department that is doing the test: ? When will my results be ready? ? How will I get my results? Summary  A cardiac nuclear scan measures the blood flow to the heart when a person is resting and when he or she is exercising.  Tell your health care provider if you are pregnant.  Before the procedure, ask your health care provider about changing or stopping your regular medicines. This is especially important if you are taking diabetes medicines or blood thinners.  After the procedure, unless your health care provider tells you otherwise, increase your fluid  intake. This will help flush the contrast dye from your body.  After the procedure, unless your health care provider tells you otherwise, you may return to your normal schedule, including diet, activities, and medicines. This information is not intended to replace advice given to you by your health care provider. Make sure you discuss any questions you have with your health care provider. Document Revised: 02/25/2018 Document Reviewed: 02/25/2018 Elsevier Patient Education  2020 [employer].  Echocardiogram An echocardiogram is a procedure that uses painless sound waves (ultrasound) to produce an image of the heart. Images from an echocardiogram can provide important information about:  Signs of coronary artery disease (CAD).  Aneurysm detection. An aneurysm is  a weak or damaged part of an artery wall that bulges out from the normal force of blood pumping through the body.  Heart size and shape. Changes in the size or shape of the heart can be associated with certain conditions, including heart failure, aneurysm, and CAD.  Heart muscle function.  Heart valve function.  Signs of a past heart attack.  Fluid buildup around the heart.  Thickening of the heart muscle.  A tumor or infectious growth around the heart valves. Tell a health care provider about:  Any allergies you have.  All medicines you are taking, including vitamins, herbs, eye drops, creams, and over-the-counter medicines.  Any blood disorders you have.  Any surgeries you have had.  Any medical conditions you have.  Whether you are pregnant or may be pregnant. What are the risks? Generally, this is a safe procedure. However, problems may occur, including:  Allergic reaction to dye (contrast) that may be used during the procedure. What happens before the procedure? No specific preparation is needed. You may eat and drink normally. What happens during the procedure?   An IV tube may be inserted into one of  your veins.  You may receive contrast through this tube. A contrast is an injection that improves the quality of the pictures from your heart.  A gel will be applied to your chest.  A wand-like tool (transducer) will be moved over your chest. The gel will help to transmit the sound waves from the transducer.  The sound waves will harmlessly bounce off of your heart to allow the heart images to be captured in real-time motion. The images will be recorded on a computer. The procedure may vary among health care providers and hospitals. What happens after the procedure?  You may return to your normal, everyday life, including diet, activities, and medicines, unless your health care provider tells you not to do that. Summary  An echocardiogram is a procedure that uses painless sound waves (ultrasound) to produce an image of the heart.  Images from an echocardiogram can provide important information about the size and shape of your heart, heart muscle function, heart valve function, and fluid buildup around your heart.  You do not need to do anything to prepare before this procedure. You may eat and drink normally.  After the echocardiogram is completed, you may return to your normal, everyday life, unless your health care provider tells you not to do that. This information is not intended to replace advice given to you by your health care provider. Make sure you discuss any questions you have with your health care provider. Document Revised: 01/02/2019 Document Reviewed: 10/14/2016 Elsevier Patient Education  2020 [employer].

## 2019-12-22 ENCOUNTER — Telehealth: Payer: Self-pay

## 2019-12-22 ENCOUNTER — Ambulatory Visit
Admission: RE | Admit: 2019-12-22 | Discharge: 2019-12-22 | Disposition: A | Discharge status: Home / Self Care | Payer: BC Managed Care – PPO | Source: Ambulatory Visit | Attending: Cardiology | Admitting: Cardiology

## 2019-12-22 ENCOUNTER — Other Ambulatory Visit: Payer: Self-pay

## 2019-12-22 DIAGNOSIS — R079 Chest pain, unspecified: Secondary | ICD-10-CM | POA: Diagnosis present

## 2019-12-22 LAB — NM MYOCAR MULTI W/SPECT W/WALL MOTION / EF
Estimated workload: 1 METS
Exercise duration (min): 0 min
Exercise duration (sec): 0 s
LV dias vol: 57 mL (ref 46–106)
LV sys vol: 25 mL
MPHR: 176 {beats}/min
Peak HR: 107 {beats}/min
Percent HR: 60 %
Rest HR: 81 {beats}/min
SDS: 5
SRS: 4
SSS: 14
TID: 1.11

## 2019-12-22 MED ORDER — NITROGLYCERIN 0.4 MG SL SUBL: 0.4000 mg | SUBLINGUAL_TABLET | Freq: Once | SUBLINGUAL | Status: DC

## 2019-12-22 MED ORDER — TECHNETIUM TC 99M TETROFOSMIN IV KIT
32.9400 | PACK | Freq: Once | INTRAVENOUS | Status: AC | PRN
  Administered 2019-12-22: 32.94 via INTRAVENOUS

## 2019-12-22 MED ORDER — TECHNETIUM TC 99M TETROFOSMIN IV KIT
10.0000 | PACK | Freq: Once | INTRAVENOUS | Status: AC | PRN
  Administered 2019-12-22: 10.85 via INTRAVENOUS

## 2019-12-22 MED ORDER — REGADENOSON 0.4 MG/5ML IV SOLN
0.4000 mg | Freq: Once | INTRAVENOUS | Status: AC
  Administered 2019-12-22: 0.4 mg via INTRAVENOUS
  Filled 2019-12-22: qty 5

## 2019-12-22 NOTE — Telephone Encounter (Signed)
-----   Message from Debbe Odea, MD sent at 12/22/2019  2:23 PM EDT ----- Stress test did not reveal ischemia in the LAD territory, apical anterior and apex of the left ventricle.  Please see if patient can see me for an earlier appointment.  She will likely need a left heart cath.  Will like to discuss with patient in person.  Thank you

## 2019-12-22 NOTE — Telephone Encounter (Signed)
-----   Message from Brian Agbor-Etang, MD sent at 12/22/2019  2:23 PM EDT ----- Stress test did not reveal ischemia in the LAD territory, apical anterior and apex of the left ventricle.  Please see if patient can see me for an earlier appointment.  She will likely need a left heart cath.  Will like to discuss with patient in person.  Thank you 

## 2019-12-22 NOTE — Telephone Encounter (Signed)
Patient made aware of stress test results with verbalized understanding.  Patient sts that she is currently in another doctors office and will need to call back to reschedule the sooner appt.

## 2019-12-22 NOTE — Telephone Encounter (Signed)
Patient called back. Stress test results and Dr. Amaryllis Dyke recommendation reviewed with the patient. Discussed with Dr. Azucena Cecil that the patient will need to start asa 81 mg qd.  Appt moved up to 12/26/19 @ 3:20pm with Dr. Azucena Cecil. Patient verbalized understanding and voiced appreciation for the call.

## 2019-12-26 ENCOUNTER — Ambulatory Visit (INDEPENDENT_AMBULATORY_CARE_PROVIDER_SITE_OTHER): Payer: BC Managed Care – PPO | Admitting: Cardiology

## 2019-12-26 ENCOUNTER — Encounter: Payer: Self-pay | Admitting: Cardiology

## 2019-12-26 ENCOUNTER — Other Ambulatory Visit: Payer: Self-pay

## 2019-12-26 ENCOUNTER — Telehealth: Payer: Self-pay

## 2019-12-26 VITALS — BP 120/84 | HR 96 | Ht 64.0 in | Wt 172.0 lb

## 2019-12-26 DIAGNOSIS — I208 Other forms of angina pectoris: Secondary | ICD-10-CM | POA: Diagnosis not present

## 2019-12-26 DIAGNOSIS — R002 Palpitations: Secondary | ICD-10-CM

## 2019-12-26 DIAGNOSIS — R011 Cardiac murmur, unspecified: Secondary | ICD-10-CM | POA: Diagnosis not present

## 2019-12-26 MED ORDER — NITROGLYCERIN 0.4 MG SL SUBL: 0.4000 mg | SUBLINGUAL_TABLET | SUBLINGUAL | 3 refills | Status: AC | PRN

## 2019-12-26 NOTE — Telephone Encounter (Signed)
Spoke with the patient and made her aware of Dr. Merita Norton response. Advised her that a scheduler will contact to reschedule her echo.  Patient verbalized understanding and voiced appreciation for the call.

## 2019-12-26 NOTE — Progress Notes (Signed)
Cardiology Office Note:    Date:  12/26/2019   ID:  Kimberly Warner, DOB May 18, 1976, MRN [id number]  PCP:  [facility]  Cardiologist:  Debbe Odea, MD  Electrophysiologist:  None   Referring MD: [facility], [facility]*   Chief Complaint  Patient presents with  . Other    Follow up post Abnormal Lexi -- Discuss Cath. Meds reviewed verbally with patient.     History of Present Illness:    Kimberly Warner is a 44 y.o. female with a hx diabetes, former smoker x 20years who presents for follow-up.  She was last seen due to palpitations and chest pain.  Patient described angina-like chest pain while going up stairs and doing her trash.  She has a significant family history of MI in both parents in their 73s.  Also described palpitations, and heart fluttering.  Lexiscan stress test was ordered, in addition to cardiac monitor and echocardiogram.   Past Medical History:  Diagnosis Date  . Acid reflux   . Diabetes mellitus without complication (HCC)   . Restless leg     Past Surgical History:  Procedure Laterality Date  . ABDOMINAL HYSTERECTOMY      Current Medications: Current Meds  Medication Sig  . amitriptyline (ELAVIL) 50 MG tablet Take 50 mg by mouth at bedtime.  Marland Kitchen aspirin EC 81 MG tablet Take 81 mg by mouth daily.  Marland Kitchen DEXILANT 60 MG capsule Take 1 capsule by mouth daily.  Marland Kitchen gabapentin (NEURONTIN) 300 MG capsule Take 300-900 mg by mouth See admin instructions. 300 mg at 1300, 900 mg at bedtime  . metFORMIN (GLUCOPHAGE) 1000 MG tablet Take 1,000 mg by mouth daily with breakfast.     Allergies:   Celebrex [celecoxib], Nsaids, Sulfa antibiotics, and Penicillins   Social History   Socioeconomic History  . Marital status: Single    Spouse name: Not on file  . Number of children: Not on file  . Years of education: Not on file  . Highest education level: Not on file  Occupational History  . Not on file  Tobacco Use  . Smoking status:  Former Games developer  . Smokeless tobacco: Never Used  Substance and Sexual Activity  . Alcohol use: No  . Drug use: No  . Sexual activity: Not on file  Other Topics Concern  . Not on file  Social History Narrative  . Not on file   Social Determinants of Health   Financial Resource Strain:   . Difficulty of Paying Living Expenses:   Food Insecurity:   . Worried About Programme researcher, broadcasting/film/video in the Last Year:   . Barista in the Last Year:   Transportation Needs:   . Freight forwarder (Medical):   Marland Kitchen Lack of Transportation (Non-Medical):   Physical Activity:   . Days of Exercise per Week:   . Minutes of Exercise per Session:   Stress:   . Feeling of Stress :   Social Connections:   . Frequency of Communication with Friends and Family:   . Frequency of Social Gatherings with Friends and Family:   . Attends Religious Services:   . Active Member of Clubs or Organizations:   . Attends Banker Meetings:   Marland Kitchen Marital Status:      Family History: The patient's family history is not on file.  ROS:   Please see the history of present illness.     All other systems reviewed and are negative.  EKGs/Labs/Other Studies Reviewed:    The following studies were reviewed today:   EKG:  EKG is  ordered today.  The ekg ordered today demonstrates normal sinus rhythm, low voltage QRS.  Recent Labs: No results found for requested labs within last 8760 hours.  Recent Lipid Panel No results found for: CHOL, TRIG, HDL, CHOLHDL, VLDL, LDLCALC, LDLDIRECT  Physical Exam:    VS:  BP 120/84 (BP Location: Left Arm, Patient Position: Sitting, Cuff Size: Normal)   Pulse 96   Ht 5\' 4"  (1.626 m)   Wt 172 lb (78 kg)   SpO2 97%   BMI 29.52 kg/m     Wt Readings from Last 3 Encounters:  12/26/19 172 lb (78 kg)  12/08/19 174 lb 2 oz (79 kg)  06/02/15 188 lb (85.3 kg)     GEN:  Well nourished, well developed in no acute distress HEENT: Normal NECK: No JVD; No carotid  bruits LYMPHATICS: No lymphadenopathy CARDIAC: RRR, 1/6 systolic murmur noted at the right upper sternal border.  RESPIRATORY:  Clear to auscultation without rales, wheezing or rhonchi  ABDOMEN: Soft, non-tender, non-distended MUSCULOSKELETAL:  No edema; No deformity  SKIN: Warm and dry NEUROLOGIC:  Alert and oriented x 3 PSYCHIATRIC:  Normal affect   ASSESSMENT:    1. Stable angina pectoris ([place])   2. Fluttering sensation of heart   3. Heart murmur    PLAN:    In order of problems listed above:  1. The patient describes occasional angina-like chest pain.  Especially when going uphill and doing her trash.  She has risk factors of diabetes, former smoker for over 20 years, early family history of CAD in both parents.  Lexiscan myocardial perfusion imaging stress test showed reversible perfusion defects in the apical anterior and apex consistent with ischemia.  We will schedule patient for left heart cath.  Obtain a fasting lipid profile. 2. Patient with palpitations/fluttering sensation in her heart.  Will evaluate with a cardiac monitor x2 weeks. 3. Systolic heart murmur heard on exam.  Echocardiogram is pending.  Follow-up left heart cath and other testing.  Total encounter time 45 minutes  Greater than 50% was spent in counseling and coordination of care with the patient time spent explaining to patient reasons for testing, findings of stress testing, indications for left heart cath, possible management options depending on findings of left heart cath.   This note was generated in part or whole with voice recognition software. Voice recognition is usually quite accurate but there are transcription errors that can and very often do occur. I apologize for any typographical errors that were not detected and corrected.  Medication Adjustments/Labs and Tests Ordered: Current medicines are reviewed at length with the patient today.  Concerns regarding medicines are outlined above.   Orders Placed This Encounter  Procedures  . Basic metabolic panel  . CBC w/Diff  . Lipid panel  . EKG 12-Lead   Meds ordered this encounter  Medications  . nitroGLYCERIN (NITROSTAT) 0.4 MG SL tablet    Sig: Place 1 tablet (0.4 mg total) under the tongue every 5 (five) minutes as needed for chest pain.    Dispense:  25 tablet    Refill:  3    Patient Instructions  Medication Instructions:  Your physician recommends that you continue on your current medications as directed. Please refer to the Current Medication list given to you today.  An Rx for sublingual Nitro-Glycerin has been sent to your pharmacy. Use as directed.  *If  you need a refill on your cardiac medications before your next appointment, please call your pharmacy*   Lab Work: Your physician recommends that you return for a FASTING lipid profile, bmet, cbc.  Please have your labs drawn at the [facility] medical mall on 12/30/19. You do not need an appt. Their hours are Mon-Fri 7:30am-5pm.   You will need a COVID test on 12/30/19. Please report to the [facility] medical arts drive up test site on 12/30/19 between 8am-1pm.   If you have labs (blood work) drawn today and your tests are completely normal, you will receive your results only by: . MyChart Message (if you have MyChart) OR . A paper copy in the mail If you have any lab test that is abnormal or we need to change your treatment, we will call you to review the results.   Testing/Procedures: Your physician has requested that you have a cardiac catheterization. Cardiac catheterization is used to diagnose and/or treat various heart conditions. Doctors may recommend this procedure for a number of different reasons. The most common reason is to evaluate chest pain. Chest pain can be a symptom of coronary artery disease (CAD), and cardiac catheterization can show whether plaque is narrowing or blocking your heart's arteries. This procedure is also used to evaluate the valves, as  well as measure the blood flow and oxygen levels in different parts of your heart. For further information please visit www.cardiosmart.org. Please follow instruction sheet, as given.     Follow-Up: At [facility], you and your health needs are our priority.  As part of our continuing mission to provide you with exceptional heart care, we have created designated Provider Care Teams.  These Care Teams include your primary Cardiologist (physician) and Advanced Practice Providers (APPs -  Physician Assistants and Nurse Practitioners) who all work together to provide you with the care you need, when you need it.  We recommend signing up for the patient portal called "MyChart".  Sign up information is provided on this After Visit Summary.  MyChart is used to connect with patients for Virtual Visits (Telemedicine).  Patients are able to view lab/test results, encounter notes, upcoming appointments, etc.  Non-urgent messages can be sent to your provider as well.   To learn more about what you can do with MyChart, go to [email or web address].    Your next appointment:   4 week(s)  The format for your next appointment:   In Person  Provider:    You may see Koltyn Kelsay Agbor-Etang, MD or one of the following Advanced Practice Providers on your designated Care Team:    Christopher Berge, NP  Ryan Dunn, PA-C  Jacquelyn Visser, PA-C    Other Instructions    [facility] [place] [address], [address] [place] [address] Dept: [phone] Loc: [phone]  Briseyda Waddell Swenson  12/26/2019  You are scheduled for a Cardiac Catheterization on Friday, April 9 with Dr. Muhammad Arida.  1. Please arrive at the [facility] (Main Entrance A) at [facility]: [address] [place], [place] [address] at 7:00 AM (This time is two hours before your procedure to ensure your preparation). Free valet parking service is  available.   Special note: Every effort is made to have your procedure done on time. Please understand that emergencies sometimes delay scheduled procedures.  2. Diet: Do not eat solid foods after midnight.  The patient may have clear liquids until 5am upon the day of   the procedure.  3. Labs: You will need to have blood drawn on Tuesday, April 6 at [facility], Go to 1st desk on your right to register.  Address: [address] [place], [place] [address]  Open: 8am - 5pm  Phone: [phone].  You will need a COVID test on 12/30/19. Please report to the [facility] medical arts drive up test site on 10/31/83 between 8am-1pm.    4. Medication instructions in preparation for your procedure:   Contrast Allergy: No   Current Outpatient Medications (Endocrine & Metabolic):  .  metFORMIN (GLUCOPHAGE) 1000 MG tablet, Take 1,000 mg by mouth daily with breakfast.    Current Outpatient Medications (Analgesics):  .  aspirin EC 81 MG tablet, Take 81 mg by mouth daily.   Current Outpatient Medications (Other):  .  amitriptyline (ELAVIL) 50 MG tablet, Take 50 mg by mouth at bedtime. Marland Kitchen  DEXILANT 60 MG capsule, Take 1 capsule by mouth daily. Marland Kitchen  gabapentin (NEURONTIN) 300 MG capsule, Take 1,200 mg by mouth daily.   Do not take Diabetes Med Glucophage (Metformin) on the day of the procedure and HOLD 48 HOURS AFTER THE PROCEDURE.  On the morning of your procedure, take your Aspirin and any morning medicines NOT listed above.  You may use sips of water.  5. Plan for one night stay--bring personal belongings. 6. Bring a current list of your medications and current insurance cards. 7. You MUST have a responsible person to drive you home. 8. Someone MUST be with you the first 24 hours after you arrive home or your discharge will be delayed. 9. Please wear clothes that are easy to get on and off and wear slip-on shoes.  Thank you for allowing [place] to care for you!   -- [facility] Invasive  Cardiovascular services      Signed, Debbe Odea, MD  12/26/2019 4:52 PM    [facility]

## 2019-12-26 NOTE — Telephone Encounter (Signed)
-----   Message from Debbe Odea, MD sent at 12/26/2019  4:55 PM EDT ----- Regarding: RE: Patients upccoming echo and cath Echo can be rescheduled. Thank you ----- Message ----- From: Jarvis Newcomer, RN Sent: 12/26/2019   3:54 PM EDT To: Sherlynn Stalls Rimmer, RN, # Subject: Patients upccoming echo and cath               Dr. Azucena Cecil  This patient is scheduled for an echo on 01/01/20 and a cardiac cath on 01/02/20. She would like to know if she needs to keep her echo appt or if it can be rescheduled and pushed out past her scheduled cardiac cath.   Misty Stanley

## 2019-12-26 NOTE — Patient Instructions (Addendum)
Medication Instructions:  Your physician recommends that you continue on your current medications as directed. Please refer to the Current Medication list given to you today.  An Rx for sublingual Nitro-Glycerin has been sent to your pharmacy. Use as directed.  *If you need a refill on your cardiac medications before your next appointment, please call your pharmacy*   Lab Work: Your physician recommends that you return for a FASTING lipid profile, bmet, cbc.  Please have your labs drawn at the [facility] medical mall on 12/30/19. You do not need an appt. Their hours are Mon-Fri 7:30am-5pm.   You will need a COVID test on 12/30/19. Please report to the [facility] medical arts drive up test site on 12/30/19 between 8am-1pm.   If you have labs (blood work) drawn today and your tests are completely normal, you will receive your results only by: Marland Kitchen MyChart Message (if you have MyChart) OR . A paper copy in the mail If you have any lab test that is abnormal or we need to change your treatment, we will call you to review the results.   Testing/Procedures: Your physician has requested that you have a cardiac catheterization. Cardiac catheterization is used to diagnose and/or treat various heart conditions. Doctors may recommend this procedure for a number of different reasons. The most common reason is to evaluate chest pain. Chest pain can be a symptom of coronary artery disease (CAD), and cardiac catheterization can show whether plaque is narrowing or blocking your heart's arteries. This procedure is also used to evaluate the valves, as well as measure the blood flow and oxygen levels in different parts of your heart. For further information please visit [email or web address].[email or web address]. Please follow instruction sheet, as given.     Follow-Up: At [facility], you and your health needs are our priority.  As part of our continuing mission to provide you with exceptional heart care, we have created designated Provider Care  Teams.  These Care Teams include your primary Cardiologist (physician) and Advanced Practice Providers (APPs -  Physician Assistants and Nurse Practitioners) who all work together to provide you with the care you need, when you need it.  We recommend signing up for the patient portal called "MyChart".  Sign up information is provided on this After Visit Summary.  MyChart is used to connect with patients for Virtual Visits (Telemedicine).  Patients are able to view lab/test results, encounter notes, upcoming appointments, etc.  Non-urgent messages can be sent to your provider as well.   To learn more about what you can do with MyChart, go to [email or web address].    Your next appointment:   4 week(s)  The format for your next appointment:   In Person  Provider:    You may see Kate Sable, MD or one of the following Advanced Practice Providers on your designated Care Team:    Murray Hodgkins, NP  Christell Faith, PA-C  Marrianne Mood, PA-C    Other Instructions    [place], [place] [address] Dept: [phone] Loc: [place]  12/26/2019  You are scheduled for a Cardiac Catheterization on Friday, April 9 with Dr. Kathlyn [place].  1. Please arrive at the [facility] (Main Entrance A) at [facility]: [address] [place], [place] [address] at 7:00 AM (This time is two hours before your procedure to ensure your preparation). Free valet parking service is available.   Special note:  Every effort is made to have your procedure done on time. Please understand that emergencies sometimes delay scheduled procedures.  2. Diet: Do not eat solid foods after midnight.  The patient may have clear liquids until 5am upon the day of the procedure.  3. Labs: You will need to have blood drawn on Tuesday, April 6 at [facility], Go to  1st desk on your right to register.  Address: [address] [place], [place] [address]  Open: 8am - 5pm  Phone: [phone].  You will need a COVID test on 12/30/19. Please report to the [facility] medical arts drive up test site on 0/1/60 between 8am-1pm.    4. Medication instructions in preparation for your procedure:   Contrast Allergy: No   Current Outpatient Medications (Endocrine & Metabolic):  .  metFORMIN (GLUCOPHAGE) 1000 MG tablet, Take 1,000 mg by mouth daily with breakfast.    Current Outpatient Medications (Analgesics):  .  aspirin EC 81 MG tablet, Take 81 mg by mouth daily.   Current Outpatient Medications (Other):  .  amitriptyline (ELAVIL) 50 MG tablet, Take 50 mg by mouth at bedtime. Marland Kitchen  DEXILANT 60 MG capsule, Take 1 capsule by mouth daily. Marland Kitchen  gabapentin (NEURONTIN) 300 MG capsule, Take 1,200 mg by mouth daily.   Do not take Diabetes Med Glucophage (Metformin) on the day of the procedure and HOLD 48 HOURS AFTER THE PROCEDURE.  On the morning of your procedure, take your Aspirin and any morning medicines NOT listed above.  You may use sips of water.  5. Plan for one night stay--bring personal belongings. 6. Bring a current list of your medications and current insurance cards. 7. You MUST have a responsible person to drive you home. 8. Someone MUST be with you the first 24 hours after you arrive home or your discharge will be delayed. 9. Please wear clothes that are easy to get on and off and wear slip-on shoes.  Thank you for allowing [place] to care for you!   -- [place] [facility] Cardiovascular services

## 2019-12-26 NOTE — H&P (View-Only) (Signed)
Cardiology Office Note:    Date:  12/26/2019   ID:  Kimberly Warner, DOB May 18, 1976, MRN [id number]  PCP:  [facility]  Cardiologist:  Debbe Odea, MD  Electrophysiologist:  None   Referring MD: [facility], [facility]*   Chief Complaint  Patient presents with  . Other    Follow up post Abnormal Lexi -- Discuss Cath. Meds reviewed verbally with patient.     History of Present Illness:    Kimberly Warner is a 44 y.o. female with a hx diabetes, former smoker x 20years who presents for follow-up.  She was last seen due to palpitations and chest pain.  Patient described angina-like chest pain while going up stairs and doing her trash.  She has a significant family history of MI in both parents in their 73s.  Also described palpitations, and heart fluttering.  Lexiscan stress test was ordered, in addition to cardiac monitor and echocardiogram.   Past Medical History:  Diagnosis Date  . Acid reflux   . Diabetes mellitus without complication (HCC)   . Restless leg     Past Surgical History:  Procedure Laterality Date  . ABDOMINAL HYSTERECTOMY      Current Medications: Current Meds  Medication Sig  . amitriptyline (ELAVIL) 50 MG tablet Take 50 mg by mouth at bedtime.  Marland Kitchen aspirin EC 81 MG tablet Take 81 mg by mouth daily.  Marland Kitchen DEXILANT 60 MG capsule Take 1 capsule by mouth daily.  Marland Kitchen gabapentin (NEURONTIN) 300 MG capsule Take 300-900 mg by mouth See admin instructions. 300 mg at 1300, 900 mg at bedtime  . metFORMIN (GLUCOPHAGE) 1000 MG tablet Take 1,000 mg by mouth daily with breakfast.     Allergies:   Celebrex [celecoxib], Nsaids, Sulfa antibiotics, and Penicillins   Social History   Socioeconomic History  . Marital status: Single    Spouse name: Not on file  . Number of children: Not on file  . Years of education: Not on file  . Highest education level: Not on file  Occupational History  . Not on file  Tobacco Use  . Smoking status:  Former Games developer  . Smokeless tobacco: Never Used  Substance and Sexual Activity  . Alcohol use: No  . Drug use: No  . Sexual activity: Not on file  Other Topics Concern  . Not on file  Social History Narrative  . Not on file   Social Determinants of Health   Financial Resource Strain:   . Difficulty of Paying Living Expenses:   Food Insecurity:   . Worried About Programme researcher, broadcasting/film/video in the Last Year:   . Barista in the Last Year:   Transportation Needs:   . Freight forwarder (Medical):   Marland Kitchen Lack of Transportation (Non-Medical):   Physical Activity:   . Days of Exercise per Week:   . Minutes of Exercise per Session:   Stress:   . Feeling of Stress :   Social Connections:   . Frequency of Communication with Friends and Family:   . Frequency of Social Gatherings with Friends and Family:   . Attends Religious Services:   . Active Member of Clubs or Organizations:   . Attends Banker Meetings:   Marland Kitchen Marital Status:      Family History: The patient's family history is not on file.  ROS:   Please see the history of present illness.     All other systems reviewed and are negative.  EKGs/Labs/Other Studies Reviewed:    The following studies were reviewed today:   EKG:  EKG is  ordered today.  The ekg ordered today demonstrates normal sinus rhythm, low voltage QRS.  Recent Labs: No results found for requested labs within last 8760 hours.  Recent Lipid Panel No results found for: CHOL, TRIG, HDL, CHOLHDL, VLDL, LDLCALC, LDLDIRECT  Physical Exam:    VS:  BP 120/84 (BP Location: Left Arm, Patient Position: Sitting, Cuff Size: Normal)   Pulse 96   Ht 5\' 4"  (1.626 m)   Wt 172 lb (78 kg)   SpO2 97%   BMI 29.52 kg/m     Wt Readings from Last 3 Encounters:  12/26/19 172 lb (78 kg)  12/08/19 174 lb 2 oz (79 kg)  06/02/15 188 lb (85.3 kg)     GEN:  Well nourished, well developed in no acute distress HEENT: Normal NECK: No JVD; No carotid  bruits LYMPHATICS: No lymphadenopathy CARDIAC: RRR, 1/6 systolic murmur noted at the right upper sternal border.  RESPIRATORY:  Clear to auscultation without rales, wheezing or rhonchi  ABDOMEN: Soft, non-tender, non-distended MUSCULOSKELETAL:  No edema; No deformity  SKIN: Warm and dry NEUROLOGIC:  Alert and oriented x 3 PSYCHIATRIC:  Normal affect   ASSESSMENT:    1. Stable angina pectoris ([place])   2. Fluttering sensation of heart   3. Heart murmur    PLAN:    In order of problems listed above:  1. The patient describes occasional angina-like chest pain.  Especially when going uphill and doing her trash.  She has risk factors of diabetes, former smoker for over 20 years, early family history of CAD in both parents.  Lexiscan myocardial perfusion imaging stress test showed reversible perfusion defects in the apical anterior and apex consistent with ischemia.  We will schedule patient for left heart cath.  Obtain a fasting lipid profile. 2. Patient with palpitations/fluttering sensation in her heart.  Will evaluate with a cardiac monitor x2 weeks. 3. Systolic heart murmur heard on exam.  Echocardiogram is pending.  Follow-up left heart cath and other testing.  Total encounter time 45 minutes  Greater than 50% was spent in counseling and coordination of care with the patient time spent explaining to patient reasons for testing, findings of stress testing, indications for left heart cath, possible management options depending on findings of left heart cath.   This note was generated in part or whole with voice recognition software. Voice recognition is usually quite accurate but there are transcription errors that can and very often do occur. I apologize for any typographical errors that were not detected and corrected.  Medication Adjustments/Labs and Tests Ordered: Current medicines are reviewed at length with the patient today.  Concerns regarding medicines are outlined above.   Orders Placed This Encounter  Procedures  . Basic metabolic panel  . CBC w/Diff  . Lipid panel  . EKG 12-Lead   Meds ordered this encounter  Medications  . nitroGLYCERIN (NITROSTAT) 0.4 MG SL tablet    Sig: Place 1 tablet (0.4 mg total) under the tongue every 5 (five) minutes as needed for chest pain.    Dispense:  25 tablet    Refill:  3    Patient Instructions  Medication Instructions:  Your physician recommends that you continue on your current medications as directed. Please refer to the Current Medication list given to you today.  An Rx for sublingual Nitro-Glycerin has been sent to your pharmacy. Use as directed.  *If  you need a refill on your cardiac medications before your next appointment, please call your pharmacy*   Lab Work: Your physician recommends that you return for a FASTING lipid profile, bmet, cbc.  Please have your labs drawn at the [facility] medical mall on 12/30/19. You do not need an appt. Their hours are Mon-Fri 7:30am-5pm.   You will need a COVID test on 12/30/19. Please report to the [facility] medical arts drive up test site on 03/26/61 between 8am-1pm.   If you have labs (blood work) drawn today and your tests are completely normal, you will receive your results only by: Marland Kitchen MyChart Message (if you have MyChart) OR . A paper copy in the mail If you have any lab test that is abnormal or we need to change your treatment, we will call you to review the results.   Testing/Procedures: Your physician has requested that you have a cardiac catheterization. Cardiac catheterization is used to diagnose and/or treat various heart conditions. Doctors may recommend this procedure for a number of different reasons. The most common reason is to evaluate chest pain. Chest pain can be a symptom of coronary artery disease (CAD), and cardiac catheterization can show whether plaque is narrowing or blocking your heart's arteries. This procedure is also used to evaluate the valves, as  well as measure the blood flow and oxygen levels in different parts of your heart. For further information please visit [email or web address] Please follow instruction sheet, as given.     Follow-Up: At [facility], you and your health needs are our priority.  As part of our continuing mission to provide you with exceptional heart care, we have created designated Provider Care Teams.  These Care Teams include your primary Cardiologist (physician) and Advanced Practice Providers (APPs -  Physician Assistants and Nurse Practitioners) who all work together to provide you with the care you need, when you need it.  We recommend signing up for the patient portal called "MyChart".  Sign up information is provided on this After Visit Summary.  MyChart is used to connect with patients for Virtual Visits (Telemedicine).  Patients are able to view lab/test results, encounter notes, upcoming appointments, etc.  Non-urgent messages can be sent to your provider as well.   To learn more about what you can do with MyChart, go to [email or web address].    Your next appointment:   4 week(s)  The format for your next appointment:   In Person  Provider:    You may see Debbe Odea, MD or one of the following Advanced Practice Providers on your designated Care Team:    Nicolasa Ducking, NP  Eula Listen, PA-C  Marisue Ivan, PA-C    Other Instructions    [facility] CARDIOVASCULAR DIVISION [facility] [address] [place] [address] Dept: [phone] Loc: [phone]  Sonda Coppens  12/26/2019  You are scheduled for a Cardiac Catheterization on Friday, April 9 with Dr. Lorine Bears.  1. Please arrive at the [facility] (Main Entrance A) at [facility]: [address] [place], [place] [address] at 7:00 AM (This time is two hours before your procedure to ensure your preparation). Free valet parking service is  available.   Special note: Every effort is made to have your procedure done on time. Please understand that emergencies sometimes delay scheduled procedures.  2. Diet: Do not eat solid foods after midnight.  The patient may have clear liquids until 5am upon the day of  the procedure.  3. Labs: You will need to have blood drawn on Tuesday, April 6 at [facility], Go to 1st desk on your right to register.  Address: [address] [place], [place] [address]  Open: 8am - 5pm  Phone: [phone].  You will need a COVID test on 12/30/19. Please report to the [facility] medical arts drive up test site on 10/31/83 between 8am-1pm.    4. Medication instructions in preparation for your procedure:   Contrast Allergy: No   Current Outpatient Medications (Endocrine & Metabolic):  .  metFORMIN (GLUCOPHAGE) 1000 MG tablet, Take 1,000 mg by mouth daily with breakfast.    Current Outpatient Medications (Analgesics):  .  aspirin EC 81 MG tablet, Take 81 mg by mouth daily.   Current Outpatient Medications (Other):  .  amitriptyline (ELAVIL) 50 MG tablet, Take 50 mg by mouth at bedtime. Marland Kitchen  DEXILANT 60 MG capsule, Take 1 capsule by mouth daily. Marland Kitchen  gabapentin (NEURONTIN) 300 MG capsule, Take 1,200 mg by mouth daily.   Do not take Diabetes Med Glucophage (Metformin) on the day of the procedure and HOLD 48 HOURS AFTER THE PROCEDURE.  On the morning of your procedure, take your Aspirin and any morning medicines NOT listed above.  You may use sips of water.  5. Plan for one night stay--bring personal belongings. 6. Bring a current list of your medications and current insurance cards. 7. You MUST have a responsible person to drive you home. 8. Someone MUST be with you the first 24 hours after you arrive home or your discharge will be delayed. 9. Please wear clothes that are easy to get on and off and wear slip-on shoes.  Thank you for allowing [place] to care for you!   -- [facility] Invasive  Cardiovascular services      Signed, Debbe Odea, MD  12/26/2019 4:52 PM    [facility]

## 2019-12-29 NOTE — Telephone Encounter (Signed)
Patient was called to schedule ECHO, states she was told to do the cardiac cath first and speak with the doctor before the echo. Patient stated she would call back at a later time to schedule echo

## 2019-12-30 ENCOUNTER — Other Ambulatory Visit
Admission: RE | Admit: 2019-12-30 | Discharge: 2019-12-30 | Disposition: A | Discharge status: Home / Self Care | Payer: BC Managed Care – PPO | Source: Ambulatory Visit | Attending: Cardiology | Admitting: Cardiology

## 2019-12-30 ENCOUNTER — Other Ambulatory Visit: Payer: Self-pay

## 2019-12-30 ENCOUNTER — Telehealth: Payer: Self-pay | Admitting: *Deleted

## 2019-12-30 DIAGNOSIS — I208 Other forms of angina pectoris: Secondary | ICD-10-CM | POA: Insufficient documentation

## 2019-12-30 DIAGNOSIS — Z01812 Encounter for preprocedural laboratory examination: Secondary | ICD-10-CM | POA: Diagnosis present

## 2019-12-30 DIAGNOSIS — Z20822 Contact with and (suspected) exposure to covid-19: Secondary | ICD-10-CM | POA: Diagnosis not present

## 2019-12-30 LAB — BASIC METABOLIC PANEL
Anion gap: 10 (ref 5–15)
BUN: 16 mg/dL (ref 6–20)
CO2: 25 mmol/L (ref 22–32)
Calcium: 9 mg/dL (ref 8.9–10.3)
Chloride: 104 mmol/L (ref 98–111)
Creatinine, Ser: 0.67 mg/dL (ref 0.44–1.00)
GFR calc Af Amer: >60 mL/min (ref >60)
GFR calc non Af Amer: >60 mL/min (ref >60)
Glucose, Bld: 114 mg/dL — ABNORMAL HIGH (ref 70–99)
Potassium: 3.8 mmol/L (ref 3.5–5.1)
Sodium: 139 mmol/L (ref 135–145)

## 2019-12-30 LAB — LIPID PANEL
Cholesterol: 235 mg/dL — ABNORMAL HIGH (ref 0–200)
HDL: 39 mg/dL — ABNORMAL LOW (ref >40)
LDL Cholesterol: 159 mg/dL — ABNORMAL HIGH (ref 0–99)
Total CHOL/HDL Ratio: 6 RATIO
Triglycerides: 187 mg/dL — ABNORMAL HIGH (ref <150)
VLDL: 37 mg/dL (ref 0–40)

## 2019-12-30 LAB — CBC WITH DIFFERENTIAL/PLATELET
Abs Immature Granulocytes: 0.02 10*3/uL (ref 0.00–0.07)
Basophils Absolute: 0 10*3/uL (ref 0.0–0.1)
Basophils Relative: 1 %
Eosinophils Absolute: 0.2 10*3/uL (ref 0.0–0.5)
Eosinophils Relative: 3 %
HCT: 38.5 % (ref 36.0–46.0)
Hemoglobin: 13.2 g/dL (ref 12.0–15.0)
Immature Granulocytes: 0 %
Lymphocytes Relative: 43 %
Lymphs Abs: 2.6 10*3/uL (ref 0.7–4.0)
MCH: 29.1 pg (ref 26.0–34.0)
MCHC: 34.3 g/dL (ref 30.0–36.0)
MCV: 85 fL (ref 80.0–100.0)
Monocytes Absolute: 0.6 10*3/uL (ref 0.1–1.0)
Monocytes Relative: 10 %
Neutro Abs: 2.6 10*3/uL (ref 1.7–7.7)
Neutrophils Relative %: 43 %
Platelets: 288 10*3/uL (ref 150–400)
RBC: 4.53 MIL/uL (ref 3.87–5.11)
RDW: 12.9 % (ref 11.5–15.5)
WBC: 6 10*3/uL (ref 4.0–10.5)
nRBC: 0 % (ref 0.0–0.2)

## 2019-12-30 LAB — SARS CORONAVIRUS 2 (TAT 6-24 HRS): SARS Coronavirus 2: NEGATIVE

## 2019-12-30 MED ORDER — ATORVASTATIN CALCIUM 40 MG PO TABS: 40.0000 mg | ORAL_TABLET | Freq: Every day | ORAL | 1 refills | Status: DC

## 2019-12-30 NOTE — Telephone Encounter (Signed)
Noted  

## 2019-12-30 NOTE — Telephone Encounter (Signed)
Results called to pt. Pt verbalized understanding of results and to start lipitor 40 mg daily.  Rx sent to pharmacy.

## 2019-12-30 NOTE — Telephone Encounter (Signed)
-----   Message from Debbe Odea, MD sent at 12/30/2019 11:11 AM EDT ----- Creatinine is normal.  Okay for left heart cath.  Cholesterol panel shows elevated cholesterol levels.  Due to patient being a diabetic, moderate intensity statin is recommended.  Start Lipitor 40 mg daily.  Keep follow-up appointments.  Thank you

## 2020-01-01 ENCOUNTER — Other Ambulatory Visit: Payer: BC Managed Care – PPO

## 2020-01-01 ENCOUNTER — Telehealth: Payer: Self-pay | Admitting: *Deleted

## 2020-01-01 NOTE — Telephone Encounter (Signed)
Pt contacted pre-catheterization scheduled at [facility] for: Friday January 02, 2020 9 AM Verified arrival time and place: [facility] Main Entrance A [facility]) at: 7 AM   No solid food after midnight prior to cath, clear liquids until 5 AM day of procedure.  Hold: Metformin-day of procedure and 48 hours after procedure  Except hold medications AM meds can be  taken pre-cath with sip of water including: ASA 81 mg   Confirmed patient has responsible adult to drive home post procedure and observe 24 hours after arriving home: yes  Currently, due to Covid-19 pandemic, only one person will be allowed with patient. Must be the same person for patient's entire stay and will be required to wear a mask. They will be asked to wait in the waiting room for the duration of the patient's stay.  Patients are required to wear a mask when they enter the hospital.      COVID-19 Pre-Screening Questions:  . In the past 7 to 10 days have you had a cough,  shortness of breath, headache, congestion, fever (100 or greater) body aches, chills, sore throat, or sudden loss of taste or sense of smell? no . Have you been around anyone with known Covid 19 in  the past 7 to 10 days? no . Have you been around anyone who is awaiting Covid 19 test results in the past 7 to 10 days? no . Have you been around anyone who has mentioned symptoms  of Covid 19? no   Reviewed procedure/mask/visitor instructions, Covid-19 screening questions with patient.

## 2020-01-02 ENCOUNTER — Ambulatory Visit (HOSPITAL_COMMUNITY)
Admission: RE | Admit: 2020-01-02 | Discharge: 2020-01-02 | Disposition: A | Discharge status: Home / Self Care | Payer: BC Managed Care – PPO | Attending: Cardiovascular Disease | Admitting: Cardiovascular Disease

## 2020-01-02 ENCOUNTER — Other Ambulatory Visit: Payer: Self-pay

## 2020-01-02 ENCOUNTER — Encounter (HOSPITAL_COMMUNITY)
Admission: RE | Disposition: A | Discharge status: Home / Self Care | Payer: Self-pay | Source: Home / Self Care | Attending: Cardiovascular Disease

## 2020-01-02 DIAGNOSIS — I208 Other forms of angina pectoris: Secondary | ICD-10-CM

## 2020-01-02 DIAGNOSIS — K219 Gastro-esophageal reflux disease without esophagitis: Secondary | ICD-10-CM | POA: Diagnosis not present

## 2020-01-02 DIAGNOSIS — I2089 Other forms of angina pectoris: Secondary | ICD-10-CM

## 2020-01-02 DIAGNOSIS — E119 Type 2 diabetes mellitus without complications: Secondary | ICD-10-CM | POA: Diagnosis not present

## 2020-01-02 DIAGNOSIS — Z7982 Long term (current) use of aspirin: Secondary | ICD-10-CM | POA: Insufficient documentation

## 2020-01-02 DIAGNOSIS — Z87891 Personal history of nicotine dependence: Secondary | ICD-10-CM | POA: Diagnosis not present

## 2020-01-02 DIAGNOSIS — Z886 Allergy status to analgesic agent status: Secondary | ICD-10-CM | POA: Diagnosis not present

## 2020-01-02 DIAGNOSIS — I25118 Atherosclerotic heart disease of native coronary artery with other forms of angina pectoris: Secondary | ICD-10-CM | POA: Insufficient documentation

## 2020-01-02 DIAGNOSIS — G2581 Restless legs syndrome: Secondary | ICD-10-CM | POA: Insufficient documentation

## 2020-01-02 DIAGNOSIS — Z7984 Long term (current) use of oral hypoglycemic drugs: Secondary | ICD-10-CM | POA: Insufficient documentation

## 2020-01-02 DIAGNOSIS — Z880 Allergy status to penicillin: Secondary | ICD-10-CM | POA: Diagnosis not present

## 2020-01-02 DIAGNOSIS — Z882 Allergy status to sulfonamides status: Secondary | ICD-10-CM | POA: Diagnosis not present

## 2020-01-02 DIAGNOSIS — Z8249 Family history of ischemic heart disease and other diseases of the circulatory system: Secondary | ICD-10-CM | POA: Diagnosis not present

## 2020-01-02 DIAGNOSIS — R011 Cardiac murmur, unspecified: Secondary | ICD-10-CM | POA: Insufficient documentation

## 2020-01-02 DIAGNOSIS — Z79899 Other long term (current) drug therapy: Secondary | ICD-10-CM | POA: Insufficient documentation

## 2020-01-02 HISTORY — PX: LEFT HEART CATH AND CORONARY ANGIOGRAPHY: CATH118249

## 2020-01-02 LAB — GLUCOSE, CAPILLARY
Glucose-Capillary: 74 mg/dL (ref 70–99)
Glucose-Capillary: 84 mg/dL (ref 70–99)

## 2020-01-02 SURGERY — LEFT HEART CATH AND CORONARY ANGIOGRAPHY
Anesthesia: LOCAL

## 2020-01-02 MED ORDER — MIDAZOLAM HCL 2 MG/2ML IJ SOLN
INTRAMUSCULAR | Status: DC | PRN
  Administered 2020-01-02: 1 mg via INTRAVENOUS

## 2020-01-02 MED ORDER — SODIUM CHLORIDE 0.9% FLUSH: 3.0000 mL | INTRAVENOUS | Status: DC | PRN

## 2020-01-02 MED ORDER — IOHEXOL 350 MG/ML SOLN
INTRAVENOUS | Status: DC | PRN
  Administered 2020-01-02: 90 mL

## 2020-01-02 MED ORDER — LIDOCAINE HCL (PF) 1 % IJ SOLN
INTRAMUSCULAR | Status: AC
  Filled 2020-01-02: qty 30

## 2020-01-02 MED ORDER — SODIUM CHLORIDE 0.9 % IV SOLN: 250.0000 mL | INTRAVENOUS | Status: DC | PRN

## 2020-01-02 MED ORDER — VERAPAMIL HCL 2.5 MG/ML IV SOLN
INTRAVENOUS | Status: AC
  Filled 2020-01-02: qty 2

## 2020-01-02 MED ORDER — MIDAZOLAM HCL 2 MG/2ML IJ SOLN
INTRAMUSCULAR | Status: AC
  Filled 2020-01-02: qty 2

## 2020-01-02 MED ORDER — SODIUM CHLORIDE 0.9% FLUSH: 3.0000 mL | Freq: Two times a day (BID) | INTRAVENOUS | Status: DC

## 2020-01-02 MED ORDER — LIDOCAINE HCL (PF) 1 % IJ SOLN
INTRAMUSCULAR | Status: DC | PRN
  Administered 2020-01-02: 2 mL

## 2020-01-02 MED ORDER — SODIUM CHLORIDE 0.9 % IV SOLN: INTRAVENOUS | Status: DC

## 2020-01-02 MED ORDER — SODIUM CHLORIDE 0.9 % WEIGHT BASED INFUSION
3.0000 mL/kg/h | INTRAVENOUS | Status: AC
  Administered 2020-01-02: 3 mL/kg/h via INTRAVENOUS

## 2020-01-02 MED ORDER — VERAPAMIL HCL 2.5 MG/ML IV SOLN
INTRAVENOUS | Status: DC | PRN
  Administered 2020-01-02: 10 mL via INTRA_ARTERIAL

## 2020-01-02 MED ORDER — FENTANYL CITRATE (PF) 100 MCG/2ML IJ SOLN
INTRAMUSCULAR | Status: DC | PRN
  Administered 2020-01-02: 50 ug via INTRAVENOUS

## 2020-01-02 MED ORDER — ONDANSETRON HCL 4 MG/2ML IJ SOLN: 4.0000 mg | Freq: Four times a day (QID) | INTRAMUSCULAR | Status: DC | PRN

## 2020-01-02 MED ORDER — HEPARIN SODIUM (PORCINE) 1000 UNIT/ML IJ SOLN
INTRAMUSCULAR | Status: AC
  Filled 2020-01-02: qty 1

## 2020-01-02 MED ORDER — HEPARIN SODIUM (PORCINE) 1000 UNIT/ML IJ SOLN
INTRAMUSCULAR | Status: DC | PRN
  Administered 2020-01-02: 4000 [IU] via INTRAVENOUS

## 2020-01-02 MED ORDER — ASPIRIN 81 MG PO CHEW
81.0000 mg | CHEWABLE_TABLET | ORAL | Status: AC
  Administered 2020-01-02: 81 mg via ORAL
  Filled 2020-01-02: qty 1

## 2020-01-02 MED ORDER — HEPARIN (PORCINE) IN NACL 1000-0.9 UT/500ML-% IV SOLN
INTRAVENOUS | Status: DC | PRN
  Administered 2020-01-02 (×2): 500 mL

## 2020-01-02 MED ORDER — SODIUM CHLORIDE 0.9 % WEIGHT BASED INFUSION: 1.0000 mL/kg/h | INTRAVENOUS | Status: DC

## 2020-01-02 MED ORDER — FENTANYL CITRATE (PF) 100 MCG/2ML IJ SOLN
INTRAMUSCULAR | Status: AC
  Filled 2020-01-02: qty 2

## 2020-01-02 MED ORDER — ACETAMINOPHEN 325 MG PO TABS: 650.0000 mg | ORAL_TABLET | ORAL | Status: DC | PRN

## 2020-01-02 MED ORDER — HEPARIN (PORCINE) IN NACL 1000-0.9 UT/500ML-% IV SOLN
INTRAVENOUS | Status: AC
  Filled 2020-01-02: qty 1000

## 2020-01-02 SURGICAL SUPPLY — 14 items
CATH INFINITI 5 FR JL3.5 (CATHETERS) ×1 IMPLANT
CATH INFINITI 5FR ANG PIGTAIL (CATHETERS) ×1 IMPLANT
CATH INFINITI 5FR JK (CATHETERS) ×1 IMPLANT
CATH INFINITI JR4 5F (CATHETERS) ×1 IMPLANT
DEVICE RAD COMP TR BAND LRG (VASCULAR PRODUCTS) ×1 IMPLANT
GLIDESHEATH SLEND SS 6F .021 (SHEATH) ×2 IMPLANT
GUIDEWIRE INQWIRE 1.5J.035X260 (WIRE) IMPLANT
INQWIRE 1.5J .035X260CM (WIRE) ×2
KIT HEART LEFT (KITS) ×2 IMPLANT
PACK CARDIAC CATHETERIZATION (CUSTOM PROCEDURE TRAY) ×2 IMPLANT
SHEATH PROBE COVER 6X72 (BAG) ×1 IMPLANT
SYR MEDRAD MARK 7 150ML (SYRINGE) ×2 IMPLANT
TRANSDUCER W/STOPCOCK (MISCELLANEOUS) ×2 IMPLANT
TUBING CIL FLEX 10 FLL-RA (TUBING) ×2 IMPLANT

## 2020-01-02 NOTE — Discharge Instructions (Signed)
Radial Site Care  This sheet gives you information about how to care for yourself after your procedure. Your health care provider may also give you more specific instructions. If you have problems or questions, contact your health care provider. What can I expect after the procedure? After the procedure, it is common to have:  Bruising and tenderness at the catheter insertion area. Follow these instructions at home: Medicines  Take over-the-counter and prescription medicines only as told by your health care provider. Insertion site care  Follow instructions from your health care provider about how to take care of your insertion site. Make sure you: ? Wash your hands with soap and water before you change your bandage (dressing). If soap and water are not available, use hand sanitizer. ? Change your dressing as told by your health care provider. ? Leave stitches (sutures), skin glue, or adhesive strips in place. These skin closures may need to stay in place for 2 weeks or longer. If adhesive strip edges start to loosen and curl up, you may trim the loose edges. Do not remove adhesive strips completely unless your health care provider tells you to do that.  Check your insertion site every day for signs of infection. Check for: ? Redness, swelling, or pain. ? Fluid or blood. ? Pus or a bad smell. ? Warmth.  Do not take baths, swim, or use a hot tub until your health care provider approves.  You may shower 24-48 hours after the procedure, or as directed by your health care provider. ? Remove the dressing and gently wash the site with plain soap and water. ? Pat the area dry with a clean towel. ? Do not rub the site. That could cause bleeding.  Do not apply powder or lotion to the site. Activity   For 24 hours after the procedure, or as directed by your health care provider: ? Do not flex or bend the affected arm. ? Do not push or pull heavy objects with the affected arm. ? Do not  drive yourself home from the hospital or clinic. You may drive 24 hours after the procedure unless your health care provider tells you not to. ? Do not operate machinery or power tools.  Do not lift anything that is heavier than 10 lb (4.5 kg), or the limit that you are told, until your health care provider says that it is safe.  Ask your health care provider when it is okay to: ? Return to work or school. ? Resume usual physical activities or sports. ? Resume sexual activity. General instructions  If the catheter site starts to bleed, raise your arm and put firm pressure on the site. If the bleeding does not stop, get help right away. This is a medical emergency.  If you went home on the same day as your procedure, a responsible adult should be with you for the first 24 hours after you arrive home.  Keep all follow-up visits as told by your health care provider. This is important. Contact a health care provider if:  You have a fever.  You have redness, swelling, or yellow drainage around your insertion site. Get help right away if:  You have unusual pain at the radial site.  The catheter insertion area swells very fast.  The insertion area is bleeding, and the bleeding does not stop when you hold steady pressure on the area.  Your arm or hand becomes pale, cool, tingly, or numb. These symptoms may represent a serious problem   that is an emergency. Do not wait to see if the symptoms will go away. Get medical help right away. Call your local emergency services (911 in the [place] Do not drive yourself to the hospital. Summary  After the procedure, it is common to have bruising and tenderness at the site.  Follow instructions from your health care provider about how to take care of your radial site wound. Check the wound every day for signs of infection.  Do not lift anything that is heavier than 10 lb (4.5 kg), or the limit that you are told, until your health care provider says  that it is safe. This information is not intended to replace advice given to you by your health care provider. Make sure you discuss any questions you have with your health care provider. Document Revised: 10/17/2017 Document Reviewed: 10/17/2017 Elsevier Patient Education  2020 Elsevier Inc.  

## 2020-01-02 NOTE — Interval H&P Note (Signed)
Cath Lab Visit (complete for each Cath Lab visit)  Clinical Evaluation Leading to the Procedure:   ACS: No.  Non-ACS:    Anginal Classification: CCS III  Anti-ischemic medical therapy: Minimal Therapy (1 class of medications)  Non-Invasive Test Results: Intermediate-risk stress test findings: cardiac mortality 1-3%/year  Prior CABG: No previous CABG      History and Physical Interval Note:  01/02/2020 10:03 AM  Kimberly Warner  has presented today for surgery, with the diagnosis of angina.  The various methods of treatment have been discussed with the patient and family. After consideration of risks, benefits and other options for treatment, the patient has consented to  Procedure(s): LEFT HEART CATH AND CORONARY ANGIOGRAPHY (N/A) as a surgical intervention.  The patient's history has been reviewed, patient examined, no change in status, stable for surgery.  I have reviewed the patient's chart and labs.  Questions were answered to the patient's satisfaction.     Lorine Bears

## 2020-01-09 ENCOUNTER — Ambulatory Visit: Payer: BC Managed Care – PPO | Admitting: Cardiology

## 2020-01-23 ENCOUNTER — Ambulatory Visit: Payer: BC Managed Care – PPO | Admitting: Cardiology

## 2020-01-27 ENCOUNTER — Encounter: Payer: Self-pay | Admitting: Cardiology

## 2020-02-17 ENCOUNTER — Other Ambulatory Visit: Payer: BC Managed Care – PPO

## 2020-09-03 ENCOUNTER — Other Ambulatory Visit: Payer: Self-pay | Admitting: *Deleted

## 2020-09-03 NOTE — Telephone Encounter (Signed)
Unable to lvm for patient at this time

## 2020-09-06 NOTE — Telephone Encounter (Signed)
Scheduled

## 2020-09-09 MED ORDER — ATORVASTATIN CALCIUM 40 MG PO TABS: 40.0000 mg | ORAL_TABLET | Freq: Every day | ORAL | 0 refills | Status: DC

## 2020-10-22 ENCOUNTER — Other Ambulatory Visit: Payer: Self-pay

## 2020-10-22 ENCOUNTER — Encounter: Payer: Self-pay | Admitting: Cardiology

## 2020-10-22 ENCOUNTER — Ambulatory Visit (INDEPENDENT_AMBULATORY_CARE_PROVIDER_SITE_OTHER): Payer: BC Managed Care – PPO | Admitting: Cardiology

## 2020-10-22 VITALS — BP 130/84 | HR 82 | Ht 64.0 in | Wt 157.0 lb

## 2020-10-22 DIAGNOSIS — I2510 Atherosclerotic heart disease of native coronary artery without angina pectoris: Secondary | ICD-10-CM

## 2020-10-22 DIAGNOSIS — E7800 Pure hypercholesterolemia, unspecified: Secondary | ICD-10-CM | POA: Diagnosis not present

## 2020-10-22 NOTE — Progress Notes (Signed)
Cardiology Office Note:    Date:  10/22/2020   ID:  Kimberly, Warner [birth year], MRN [id number]  PCP:  Center, [facility]  Cardiologist:  Debbe Odea, MD  Electrophysiologist:  None   Referring MD: Center, [facility]*   Chief Complaint  Patient presents with  . Follow-up    History of Present Illness:    Kimberly Warner is a 45 y.o. female with a hx CAD (LHC- occ mid LAD w/Left to left collaterals), diabetes, former smoker x 20years who presents for follow-up.    Previously seen for chest pain consistent with angina.  Lexiscan Myoview was abnormal.  Underwent left heart cath 12/2019 showing chronically occluded proximal to mid LAD with left to left collaterals.  Medical management advised.  EF on LV gram was normal on LV gram/left heart cath. transthoracic echocardiogram still pending.  She states feeling well since her left heart cath, denies chest pain or shortness of breath at rest or with exertion.  Taking all her medications as prescribed.  Prior notes  she has a significant family history of MI in both parents in their 16s.  Myoview 11/2019 reversible perfusion defect in the LV apical anterior and apex. Left heart cath 12/2019 chronically occluded mid LAD with left to left collaterals.  Proximal RCA 40% stenosed.  EF on LV gram normal by visual estimate   Past Medical History:  Diagnosis Date  . Acid reflux   . Diabetes mellitus without complication (HCC)   . Restless leg     Past Surgical History:  Procedure Laterality Date  . ABDOMINAL HYSTERECTOMY    . CARDIAC CATHETERIZATION    . LEFT HEART CATH AND CORONARY ANGIOGRAPHY N/A 01/02/2020   Procedure: LEFT HEART CATH AND CORONARY ANGIOGRAPHY;  Surgeon: Iran Ouch, MD;  [facility];  Service: Cardiovascular;  Laterality: N/A;    Current Medications: Current Meds  Medication Sig  . aspirin EC 81 MG tablet Take 81 mg by mouth daily.  Marland Kitchen atorvastatin (LIPITOR) 40  MG tablet Take 1 tablet (40 mg total) by mouth daily at 6 PM.  . DEXILANT 60 MG capsule Take 1 capsule by mouth daily.  Marland Kitchen gabapentin (NEURONTIN) 300 MG capsule Take 300-900 mg by mouth See admin instructions. 300 mg at 1300, 900 mg at bedtime  . metFORMIN (GLUCOPHAGE) 1000 MG tablet Take 1,000 mg by mouth daily with breakfast.  . nitroGLYCERIN (NITROSTAT) 0.4 MG SL tablet Place 1 tablet (0.4 mg total) under the tongue every 5 (five) minutes as needed for chest pain.     Allergies:   Celebrex [celecoxib], Nsaids, Sulfa antibiotics, and Penicillins   Social History   Socioeconomic History  . Marital status: Single    Spouse name: Not on file  . Number of children: Not on file  . Years of education: Not on file  . Highest education level: Not on file  Occupational History  . Not on file  Tobacco Use  . Smoking status: Former Games developer  . Smokeless tobacco: Never Used  Vaping Use  . Vaping Use: Never used  Substance and Sexual Activity  . Alcohol use: No  . Drug use: No  . Sexual activity: Not on file  Other Topics Concern  . Not on file  Social History Narrative  . Not on file   Social Determinants of Health   Financial Resource Strain: Not on file  Food Insecurity: Not on file  Transportation Needs: Not on file  Physical Activity: Not  on file  Stress: Not on file  Social Connections: Not on file     Family History: The patient's family history includes Diabetes in her mother; Lung cancer in her mother.  ROS:   Please see the history of present illness.     All other systems reviewed and are negative.  EKGs/Labs/Other Studies Reviewed:    The following studies were reviewed today:   EKG:  EKG is  ordered today.  The ekg ordered today demonstrates normal sinus rhythm, low voltage QRS.  Recent Labs: 12/30/2019: BUN 16; Creatinine, Ser 0.67; Hemoglobin 13.2; Platelets 288; Potassium 3.8; Sodium 139  Recent Lipid Panel    Component Value Date/Time   CHOL 235 (H)  12/30/2019 0800   TRIG 187 (H) 12/30/2019 0800   HDL 39 (L) 12/30/2019 0800   CHOLHDL 6.0 12/30/2019 0800   VLDL 37 12/30/2019 0800   LDLCALC 159 (H) 12/30/2019 0800    Physical Exam:    VS:  BP 130/84 (BP Location: Left Arm, Patient Position: Sitting, Cuff Size: Normal)   Pulse 82   Ht 5\' 4"  (1.626 m)   Wt 157 lb (71.2 kg)   SpO2 98%   BMI 26.95 kg/m     Wt Readings from Last 3 Encounters:  10/22/20 157 lb (71.2 kg)  01/02/20 172 lb (78 kg)  12/26/19 172 lb (78 kg)     GEN:  Well nourished, well developed in no acute distress HEENT: Normal NECK: No JVD; No carotid bruits LYMPHATICS: No lymphadenopathy CARDIAC: RRR, no murmur RESPIRATORY:  Clear to auscultation without rales, wheezing or rhonchi  ABDOMEN: Soft, non-tender, non-distended MUSCULOSKELETAL:  No edema; No deformity  SKIN: Warm and dry NEUROLOGIC:  Alert and oriented x 3 PSYCHIATRIC:  Normal affect   ASSESSMENT:    1. Coronary artery disease involving native coronary artery of native heart, unspecified whether angina present   2. Pure hypercholesterolemia    PLAN:    In order of problems listed above:  1. CAD/chronically occluded mid LAD, continue aspirin 81 mg, Lipitor 40.  Currently denies any symptoms of chest pain.  If symptoms were to occur, will start antianginal meds such as Toprol-XL.  This was discussed with patient, she declined starting any additional meds at this time.  Repeat fasting lipid profile.  Obtain echocardiogram.  EKG showing low voltage QRS.  2. Hyperlipidemia, goal LDL less than 70.  Repeat fasting lipid profile as above.  Continue Lipitor 40 mg daily.  Follow-up in 6 months   This note was generated in part or whole with voice recognition software. Voice recognition is usually quite accurate but there are transcription errors that can and very often do occur. I apologize for any typographical errors that were not detected and corrected.  Medication Adjustments/Labs and Tests  Ordered: Current medicines are reviewed at length with the patient today.  Concerns regarding medicines are outlined above.  Orders Placed This Encounter  Procedures  . Lipid panel  . EKG 12-Lead  . ECHOCARDIOGRAM COMPLETE   No orders of the defined types were placed in this encounter.   Patient Instructions  Medication Instructions:  Your physician recommends that you continue on your current medications as directed. Please refer to the Current Medication list given to you today.  *If you need a refill on your cardiac medications before your next appointment, please call your pharmacy*   Lab Work:  Your physician recommends that you return for a FASTING lipid profile: at your earliest convenience.   Testing/Procedures:  Your physician has requested that you have an echocardiogram. Echocardiography is a painless test that uses sound waves to create images of your heart. It provides your doctor with information about the size and shape of your heart and how well your heart's chambers and valves are working. This procedure takes approximately one hour. There are no restrictions for this procedure.   Follow-Up: At [facility], you and your health needs are our priority.  As part of our continuing mission to provide you with exceptional heart care, we have created designated Provider Care Teams.  These Care Teams include your primary Cardiologist (physician) and Advanced Practice Providers (APPs -  Physician Assistants and Nurse Practitioners) who all work together to provide you with the care you need, when you need it.  We recommend signing up for the patient portal called "MyChart".  Sign up information is provided on this After Visit Summary.  MyChart is used to connect with patients for Virtual Visits (Telemedicine).  Patients are able to view lab/test results, encounter notes, upcoming appointments, etc.  Non-urgent messages can be sent to your provider as well.   To learn more  about what you can do with MyChart, go to [email or web address].    Your next appointment:   6 month(s)  The format for your next appointment:   In Person  Provider:   Debbe Odea, MD   Other Instructions      Signed, Debbe Odea, MD  10/22/2020 5:07 PM    [facility]

## 2020-10-22 NOTE — Patient Instructions (Signed)
Medication Instructions:  Your physician recommends that you continue on your current medications as directed. Please refer to the Current Medication list given to you today.  *If you need a refill on your cardiac medications before your next appointment, please call your pharmacy*   Lab Work:  Your physician recommends that you return for a FASTING lipid profile: at your earliest convenience.   Testing/Procedures:  Your physician has requested that you have an echocardiogram. Echocardiography is a painless test that uses sound waves to create images of your heart. It provides your doctor with information about the size and shape of your heart and how well your heart's chambers and valves are working. This procedure takes approximately one hour. There are no restrictions for this procedure.   Follow-Up: At [facility], you and your health needs are our priority.  As part of our continuing mission to provide you with exceptional heart care, we have created designated Provider Care Teams.  These Care Teams include your primary Cardiologist (physician) and Advanced Practice Providers (APPs -  Physician Assistants and Nurse Practitioners) who all work together to provide you with the care you need, when you need it.  We recommend signing up for the patient portal called "MyChart".  Sign up information is provided on this After Visit Summary.  MyChart is used to connect with patients for Virtual Visits (Telemedicine).  Patients are able to view lab/test results, encounter notes, upcoming appointments, etc.  Non-urgent messages can be sent to your provider as well.   To learn more about what you can do with MyChart, go to [email or web address].    Your next appointment:   6 month(s)  The format for your next appointment:   In Person  Provider:   Debbe Odea, MD   Other Instructions

## 2020-11-16 ENCOUNTER — Other Ambulatory Visit: Payer: Self-pay

## 2020-11-16 ENCOUNTER — Ambulatory Visit (INDEPENDENT_AMBULATORY_CARE_PROVIDER_SITE_OTHER): Payer: BC Managed Care – PPO

## 2020-11-16 DIAGNOSIS — I2510 Atherosclerotic heart disease of native coronary artery without angina pectoris: Secondary | ICD-10-CM | POA: Diagnosis not present

## 2020-11-17 LAB — ECHOCARDIOGRAM COMPLETE
Area-P 1/2: 3.07 cm2
S' Lateral: 2.4 cm

## 2020-11-19 ENCOUNTER — Encounter: Payer: Self-pay | Admitting: Cardiology

## 2021-04-25 ENCOUNTER — Other Ambulatory Visit: Payer: Self-pay | Admitting: Neurosurgery

## 2021-04-25 ENCOUNTER — Other Ambulatory Visit (HOSPITAL_COMMUNITY): Payer: Self-pay | Admitting: Neurosurgery

## 2021-04-25 DIAGNOSIS — M5416 Radiculopathy, lumbar region: Secondary | ICD-10-CM

## 2021-05-05 ENCOUNTER — Ambulatory Visit
Admission: RE | Admit: 2021-05-05 | Discharge: 2021-05-05 | Disposition: A | Discharge status: Home / Self Care | Payer: BC Managed Care – PPO | Source: Ambulatory Visit | Attending: Neurosurgery | Admitting: Neurosurgery

## 2021-05-05 ENCOUNTER — Other Ambulatory Visit: Payer: Self-pay

## 2021-05-05 DIAGNOSIS — M5416 Radiculopathy, lumbar region: Secondary | ICD-10-CM | POA: Diagnosis present

## 2021-08-08 ENCOUNTER — Encounter: Payer: Self-pay | Admitting: Cardiology

## 2021-08-08 ENCOUNTER — Other Ambulatory Visit: Payer: Self-pay

## 2021-08-08 ENCOUNTER — Ambulatory Visit: Payer: BC Managed Care – PPO | Admitting: Cardiology

## 2021-08-08 VITALS — BP 120/80 | HR 78 | Ht 64.0 in | Wt 152.0 lb

## 2021-08-08 DIAGNOSIS — I2510 Atherosclerotic heart disease of native coronary artery without angina pectoris: Secondary | ICD-10-CM | POA: Diagnosis not present

## 2021-08-08 DIAGNOSIS — E7800 Pure hypercholesterolemia, unspecified: Secondary | ICD-10-CM | POA: Diagnosis not present

## 2021-08-08 NOTE — Progress Notes (Signed)
Cardiology Office Note:    Date:  08/08/2021   ID:  Kimberly Warner, DOB 08-07-1976, MRN [id number]  PCP:  [facility], [facility]  Cardiologist:  Debbe Odea, MD  Electrophysiologist:  None   Referring MD: Center, [facility]*   Chief Complaint  Patient presents with   Other    Past due follow up -- Meds reviewed verbally with patient.      History of Present Illness:    Kimberly Warner is a 45 y.o. female with a hx CAD (LHC 12/2019- occ mid LAD w/Left to left collaterals, prox RCA 40%), hyperlipidemia diabetes, former smoker x 20years who presents for follow-up.    She is being seen for CAD and hyperlipidemia, fasting lipid profile was ordered after last visit, patient developed COVID, did not obtain lab work.  States having some chest discomfort attributed to PPIs.  Switch from Dexilant to esomeprazole due to insurance issues.  Esomeprazole seems to be controlling her reflux symptoms.  She otherwise has no concerns at this time.  Takes aspirin and Lipitor as prescribed.   Prior notes Echocardiogram 10/2020 EF 60 to 65%, impaired relaxation. she has a significant family history of MI in both parents in their 41s.  Myoview 11/2019 reversible perfusion defect in the LV apical anterior and apex. Left heart cath 12/2019 chronically occluded mid LAD with left to left collaterals.  Proximal RCA 40% stenosed.  EF on LV gram normal by visual estimate   Past Medical History:  Diagnosis Date   Acid reflux    Diabetes mellitus without complication (HCC)    Restless leg     Past Surgical History:  Procedure Laterality Date   ABDOMINAL HYSTERECTOMY     CARDIAC CATHETERIZATION     LEFT HEART CATH AND CORONARY ANGIOGRAPHY N/A 01/02/2020   Procedure: LEFT HEART CATH AND CORONARY ANGIOGRAPHY;  Surgeon: Iran Ouch, MD;  Location: [facility];  Service: Cardiovascular;  Laterality: N/A;    Current Medications: Current Meds  Medication Sig   aspirin  EC 81 MG tablet Take 81 mg by mouth daily.   atorvastatin (LIPITOR) 40 MG tablet Take 1 tablet (40 mg total) by mouth daily at 6 PM.   esomeprazole (NEXIUM) 10 MG packet Take 10 mg by mouth daily before breakfast.   gabapentin (NEURONTIN) 300 MG capsule Take 300-900 mg by mouth See admin instructions. 300 mg at 1300, 900 mg at bedtime   metFORMIN (GLUCOPHAGE) 1000 MG tablet Take 1,000 mg by mouth daily with breakfast.   nitroGLYCERIN (NITROSTAT) 0.4 MG SL tablet Place 1 tablet (0.4 mg total) under the tongue every 5 (five) minutes as needed for chest pain.   [DISCONTINUED] DEXILANT 60 MG capsule Take 1 capsule by mouth daily.     Allergies:   Celebrex [celecoxib], Nsaids, Sulfa antibiotics, and Penicillins   Social History   Socioeconomic History   Marital status: Married    Spouse name: Not on file   Number of children: Not on file   Years of education: Not on file   Highest education level: Not on file  Occupational History   Not on file  Tobacco Use   Smoking status: Former   Smokeless tobacco: Never  Vaping Use   Vaping Use: Never used  Substance and Sexual Activity   Alcohol use: No   Drug use: No   Sexual activity: Not on file  Other Topics Concern   Not on file  Social History Narrative   Not on file  Social Determinants of Health   Financial Resource Strain: Not on file  Food Insecurity: Not on file  Transportation Needs: Not on file  Physical Activity: Not on file  Stress: Not on file  Social Connections: Not on file     Family History: The patient's family history includes Diabetes in her mother; Lung cancer in her mother.  ROS:   Please see the history of present illness.     All other systems reviewed and are negative.  EKGs/Labs/Other Studies Reviewed:    The following studies were reviewed today:   EKG:  EKG is  ordered today.  The ekg ordered today demonstrates normal sinus rhythm, low voltage QRS.  Recent Labs: No results found for  requested labs within last 8760 hours.  Recent Lipid Panel    Component Value Date/Time   CHOL 235 (H) 12/30/2019 0800   TRIG 187 (H) 12/30/2019 0800   HDL 39 (L) 12/30/2019 0800   CHOLHDL 6.0 12/30/2019 0800   VLDL 37 12/30/2019 0800   LDLCALC 159 (H) 12/30/2019 0800    Physical Exam:    VS:  BP 120/80 (BP Location: Left Arm, Patient Position: Sitting, Cuff Size: Normal)   Pulse 78   Ht 5\' 4"  (1.626 m)   Wt 152 lb (68.9 kg)   SpO2 99%   BMI 26.09 kg/m     Wt Readings from Last 3 Encounters:  08/08/21 152 lb (68.9 kg)  10/22/20 157 lb (71.2 kg)  01/02/20 172 lb (78 kg)     GEN:  Well nourished, well developed in no acute distress HEENT: Normal NECK: No JVD; No carotid bruits LYMPHATICS: No lymphadenopathy CARDIAC: RRR, no murmur RESPIRATORY:  Clear to auscultation without rales, wheezing or rhonchi  ABDOMEN: Soft, non-tender, non-distended MUSCULOSKELETAL:  No edema; No deformity  SKIN: Warm and dry NEUROLOGIC:  Alert and oriented x 3 PSYCHIATRIC:  Normal affect   ASSESSMENT:    1. Coronary artery disease involving native coronary artery of native heart, unspecified whether angina present   2. Pure hypercholesterolemia     PLAN:    In order of problems listed above:  CAD/chronically occluded mid LAD, 40%RCA. continue aspirin 81 mg, Lipitor 40.  Echo shows normal systolic function EF 60 to 65%, impaired relaxation. Hyperlipidemia, goal LDL less than 70.  Advised to obtain fasting lipid profile.  Continue Lipitor 40 mg daily.  Follow-up yearly.   This note was generated in part or whole with voice recognition software. Voice recognition is usually quite accurate but there are transcription errors that can and very often do occur. I apologize for any typographical errors that were not detected and corrected.  Medication Adjustments/Labs and Tests Ordered: Current medicines are reviewed at length with the patient today.  Concerns regarding medicines are outlined  above.  Orders Placed This Encounter  Procedures   Lipid panel   EKG 12-Lead    No orders of the defined types were placed in this encounter.   Patient Instructions  Medication Instructions:   Your physician recommends that you continue on your current medications as directed. Please refer to the Current Medication list given to you today.   *If you need a refill on your cardiac medications before your next appointment, please call your pharmacy*   Lab Work:  Your physician recommends that you return for lab work (Fasting Lipid) in: At your Convenience  - You will need to be fasting. Please do not have anything to eat or drink after midnight the morning you  have the lab work. You may only have water or black coffee with no cream or sugar.   Please return to our office on_____________________at______________am/pm    Testing/Procedures: None ordered   Follow-Up: At [facility], you and your health needs are our priority.  As part of our continuing mission to provide you with exceptional heart care, we have created designated Provider Care Teams.  These Care Teams include your primary Cardiologist (physician) and Advanced Practice Providers (APPs -  Physician Assistants and Nurse Practitioners) who all work together to provide you with the care you need, when you need it.  We recommend signing up for the patient portal called "MyChart".  Sign up information is provided on this After Visit Summary.  MyChart is used to connect with patients for Virtual Visits (Telemedicine).  Patients are able to view lab/test results, encounter notes, upcoming appointments, etc.  Non-urgent messages can be sent to your provider as well.   To learn more about what you can do with MyChart, go to [email or web address].    Your next appointment:   1 year(s)  The format for your next appointment:   In Person  Provider:   You may see Debbe Odea, MD or one of the following Advanced  Practice Providers on your designated Care Team:   Nicolasa Ducking, NP Eula Listen, PA-C Cadence Fransico Michael, [place]    Other Instructions    Signed, Debbe Odea, MD  08/08/2021 4:47 PM    [facility]

## 2021-08-08 NOTE — Patient Instructions (Signed)
Medication Instructions:   Your physician recommends that you continue on your current medications as directed. Please refer to the Current Medication list given to you today.   *If you need a refill on your cardiac medications before your next appointment, please call your pharmacy*   Lab Work:  Your physician recommends that you return for lab work (Fasting Lipid) in: At your Convenience  - You will need to be fasting. Please do not have anything to eat or drink after midnight the morning you have the lab work. You may only have water or black coffee with no cream or sugar.   Please return to our office on_____________________at______________am/pm    Testing/Procedures: None ordered   Follow-Up: At [facility], you and your health needs are our priority.  As part of our continuing mission to provide you with exceptional heart care, we have created designated Provider Care Teams.  These Care Teams include your primary Cardiologist (physician) and Advanced Practice Providers (APPs -  Physician Assistants and Nurse Practitioners) who all work together to provide you with the care you need, when you need it.  We recommend signing up for the patient portal called "MyChart".  Sign up information is provided on this After Visit Summary.  MyChart is used to connect with patients for Virtual Visits (Telemedicine).  Patients are able to view lab/test results, encounter notes, upcoming appointments, etc.  Non-urgent messages can be sent to your provider as well.   To learn more about what you can do with MyChart, go to [email or web address].    Your next appointment:   1 year(s)  The format for your next appointment:   In Person  Provider:   You may see Debbe Odea, MD or one of the following Advanced Practice Providers on your designated Care Team:   Nicolasa Ducking, NP Eula Listen, PA-C Cadence Fransico Michael, [place]    Other Instructions

## 2021-08-09 ENCOUNTER — Telehealth: Payer: Self-pay | Admitting: Cardiology

## 2021-08-09 NOTE — Telephone Encounter (Signed)
L mom to schedule labwork (LIPID) at patient's earliest convenience

## 2021-11-25 ENCOUNTER — Other Ambulatory Visit: Payer: Self-pay | Admitting: Family Medicine

## 2021-11-25 DIAGNOSIS — M7989 Other specified soft tissue disorders: Secondary | ICD-10-CM

## 2021-12-01 ENCOUNTER — Other Ambulatory Visit: Payer: Self-pay

## 2021-12-01 ENCOUNTER — Ambulatory Visit
Admission: RE | Admit: 2021-12-01 | Discharge: 2021-12-01 | Disposition: A | Discharge status: Home / Self Care | Payer: BC Managed Care – PPO | Source: Ambulatory Visit | Attending: Family Medicine | Admitting: Family Medicine

## 2021-12-01 DIAGNOSIS — M7989 Other specified soft tissue disorders: Secondary | ICD-10-CM | POA: Insufficient documentation

## 2022-02-07 ENCOUNTER — Other Ambulatory Visit: Payer: Self-pay | Admitting: Family

## 2022-02-07 DIAGNOSIS — R0989 Other specified symptoms and signs involving the circulatory and respiratory systems: Secondary | ICD-10-CM

## 2022-02-08 ENCOUNTER — Ambulatory Visit
Admission: RE | Admit: 2022-02-08 | Discharge: 2022-02-08 | Disposition: A | Discharge status: Home / Self Care | Payer: BC Managed Care – PPO | Source: Ambulatory Visit | Attending: Family | Admitting: Family

## 2022-02-08 DIAGNOSIS — R0989 Other specified symptoms and signs involving the circulatory and respiratory systems: Secondary | ICD-10-CM | POA: Diagnosis not present

## 2022-02-10 ENCOUNTER — Emergency Department
Admission: EM | Admit: 2022-02-10 | Discharge: 2022-02-10 | Disposition: A | Discharge status: Home / Self Care | Payer: BC Managed Care – PPO | Attending: Emergency Medicine | Admitting: Emergency Medicine

## 2022-02-10 ENCOUNTER — Encounter: Payer: Self-pay | Admitting: Emergency Medicine

## 2022-02-10 ENCOUNTER — Emergency Department: Payer: BC Managed Care – PPO

## 2022-02-10 ENCOUNTER — Other Ambulatory Visit: Payer: Self-pay

## 2022-02-10 DIAGNOSIS — I67848 Other cerebrovascular vasospasm and vasoconstriction: Secondary | ICD-10-CM

## 2022-02-10 DIAGNOSIS — R519 Headache, unspecified: Secondary | ICD-10-CM | POA: Diagnosis present

## 2022-02-10 LAB — CBC
HCT: 42.2 % (ref 36.0–46.0)
Hemoglobin: 14.5 g/dL (ref 12.0–15.0)
MCH: 29.7 pg (ref 26.0–34.0)
MCHC: 34.4 g/dL (ref 30.0–36.0)
MCV: 86.3 fL (ref 80.0–100.0)
Platelets: 286 10*3/uL (ref 150–400)
RBC: 4.89 MIL/uL (ref 3.87–5.11)
RDW: 12.3 % (ref 11.5–15.5)
WBC: 5.8 10*3/uL (ref 4.0–10.5)
nRBC: 0 % (ref 0.0–0.2)

## 2022-02-10 LAB — COMPREHENSIVE METABOLIC PANEL
ALT: 13 U/L (ref 0–44)
AST: 20 U/L (ref 15–41)
Albumin: 4.2 g/dL (ref 3.5–5.0)
Alkaline Phosphatase: 60 U/L (ref 38–126)
Anion gap: 11 (ref 5–15)
BUN: 13 mg/dL (ref 6–20)
CO2: 28 mmol/L (ref 22–32)
Calcium: 9.5 mg/dL (ref 8.9–10.3)
Chloride: 101 mmol/L (ref 98–111)
Creatinine, Ser: 0.61 mg/dL (ref 0.44–1.00)
GFR, Estimated: >60 mL/min (ref >60)
Glucose, Bld: 109 mg/dL — ABNORMAL HIGH (ref 70–99)
Potassium: 3.8 mmol/L (ref 3.5–5.1)
Sodium: 140 mmol/L (ref 135–145)
Total Bilirubin: 0.6 mg/dL (ref 0.3–1.2)
Total Protein: 7.6 g/dL (ref 6.5–8.1)

## 2022-02-10 MED ORDER — ACETAMINOPHEN 500 MG PO TABS
1000.0000 mg | ORAL_TABLET | Freq: Once | ORAL | Status: AC
  Administered 2022-02-10: 1000 mg via ORAL
  Filled 2022-02-10: qty 2

## 2022-02-10 MED ORDER — METOCLOPRAMIDE HCL 5 MG/ML IJ SOLN
10.0000 mg | Freq: Once | INTRAMUSCULAR | Status: AC
  Administered 2022-02-10: 10 mg via INTRAVENOUS
  Filled 2022-02-10: qty 2

## 2022-02-10 MED ORDER — IOHEXOL 350 MG/ML SOLN
75.0000 mL | Freq: Once | INTRAVENOUS | Status: AC | PRN
  Administered 2022-02-10: 75 mL via INTRAVENOUS

## 2022-02-10 MED ORDER — VERAPAMIL HCL 2.5 MG/ML IV SOLN
5.0000 mg | Freq: Once | INTRAVENOUS | Status: AC
  Administered 2022-02-10: 5 mg via INTRAVENOUS
  Filled 2022-02-10: qty 2

## 2022-02-10 MED ORDER — SODIUM CHLORIDE 0.9 % IV BOLUS
1000.0000 mL | Freq: Once | INTRAVENOUS | Status: AC
  Administered 2022-02-10: 1000 mL via INTRAVENOUS

## 2022-02-10 MED ORDER — DIPHENHYDRAMINE HCL 50 MG/ML IJ SOLN
50.0000 mg | Freq: Once | INTRAMUSCULAR | Status: AC
  Administered 2022-02-10: 50 mg via INTRAVENOUS
  Filled 2022-02-10: qty 1

## 2022-02-10 MED ORDER — TRAMADOL HCL 50 MG PO TABS: 50.0000 mg | ORAL_TABLET | Freq: Every evening | ORAL | 0 refills | Status: DC | PRN

## 2022-02-10 MED ORDER — VERAPAMIL HCL 40 MG PO TABS: 40.0000 mg | ORAL_TABLET | Freq: Three times a day (TID) | ORAL | 0 refills | Status: AC

## 2022-02-10 NOTE — ED Provider Notes (Signed)
[facility] Provider Note    Event Date/Time   First MD Initiated Contact with Patient 02/10/22 0542     (approximate)  History   Chief Complaint: Headache  HPI  Kimberly Warner is a 46 y.o. female patient presents emergency department for right-sided headache.  According to the patient for the past 1 to 2 weeks she has been experiencing intermittent headaches.  She states prior to this she never experienced headaches Alese not significant headaches.  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  Ultrasound showed carotid stenosis, she told the patient to go to the emergency department for headache returns.  Patient states last night around 9 PM the headache returned and continued to worsen.  Patient states she has not been able to sleep much tonight due to the headache so she came to the emergency department for evaluation.  Patient denies any weakness numbness confusion or slurred speech.  Overall the patient appears well, no distress.  Physical Exam   Triage Vital Signs: ED Triage Vitals  Enc Vitals Group     BP 02/10/22 0537 (!) 189/90     Pulse Rate 02/10/22 0537 90     Resp 02/10/22 0537 16     Temp 02/10/22 0537 99.1 F (37.3 C)     Temp Source 02/10/22 0537 Oral     SpO2 02/10/22 0537 99 %     Weight 02/10/22 0537 148 lb (67.1 kg)     Height 02/10/22 0537 5\' 4"  (1.626 m)     Head Circumference --      Peak Flow --      Pain Score 02/10/22 0536 5     Pain Loc --      Pain Edu? --      Excl. in GC? --     Most recent vital signs: Vitals:   02/10/22 0537  BP: (!) 189/90  Pulse: 90  Resp: 16  Temp: 99.1 F (37.3 C)  SpO2: 99%    General: Awake, no distress.  CV:  Good peripheral perfusion.  Regular rate and rhythm  Resp:  Normal effort.  Equal breath sounds bilaterally.  Abd:  No distention.  Soft, nontender.  No rebound or  guarding.   ED Results / Procedures / Treatments   RADIOLOGY  CT pending   MEDICATIONS ORDERED IN ED: Medications  metoCLOPramide (REGLAN) injection 10 mg (has no administration in time range)  diphenhydrAMINE (BENADRYL) injection 50 mg (has no administration in time range)  sodium chloride 0.9 % bolus 1,000 mL (has no administration in time range)  acetaminophen (TYLENOL) tablet 1,000 mg (has no administration in time range)     IMPRESSION / MDM / ASSESSMENT AND PLAN / ED COURSE  I reviewed the triage vital signs and the nursing notes.  Patient presents emergency department for worsening headache.  Patient states intermittent headaches over the past 2 weeks and no headaches prior to this.  Patient did have an ultrasound recently which I reviewed showing moderate stenosis of the carotid with mild stenosis of the other carotid.  Patient states the headache is more of a pulsing mostly on the right side.  States this is associated with ear pain as well.  Tympanic membranes normal on my examination.  Given the pulsatile description of the headache and headache x2 weeks with no history of headaches previously we will check labs obtain CTA imaging  of the head and neck to rule out aneurysm.  Patient agreeable to plan of care.  We will treat with Tylenol, Reglan Benadryl and IV fluids while awaiting results.  CBC is normal.  Remaining lab work and CT scans are pending.  Patient care signed out to oncoming provider.  FINAL CLINICAL IMPRESSION(S) / ED DIAGNOSES   Headache  Note:  This document was prepared using Dragon voice recognition software and may include unintentional dictation errors.   Minna Antis, MD 02/10/22 [phone]

## 2022-02-10 NOTE — ED Notes (Signed)
RN to bedside to introduce self to pt. Pt daughter at bedside curled up in bench seat. This RN got her a recliner for comfort. Pt has no needs or requests at this time.

## 2022-02-10 NOTE — ED Provider Notes (Signed)
Procedures     ----------------------------------------- 12:12 PM on 02/10/2022 -----------------------------------------  CTA viewed and interpreted by me, no obvious large aneurysm or intracranial hemorrhage.  Radiology report reviewed which shows signs of reversible vasoconstriction syndrome.  Discussed with neurology who agrees, recommends verapamil 3 times daily for 1 week and tramadol nightly for restless leg syndrome.  Stable for discharge and outpatient follow-up.    Carrie Mew, MD 02/10/22 1212

## 2022-02-10 NOTE — Discharge Instructions (Addendum)
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  Take verapamil as prescribed to alleviate this issue and avoid caffeine and marijuana for the next 2 weeks at least.

## 2022-02-10 NOTE — Consult Note (Signed)
Neurology Consultation Reason for Consult: Abnormal CT Referring Physician: Lenard Lance, K  CC: Headache  History is obtained from: Patient  HPI: Kimberly Warner is a 46 y.o. female who has had multiple recurrent thunderclap headaches since last Sunday.  She states that she typically drinks 1 cup of coffee a day, but this day got a large glass bottle of Starbucks Frappuccino.  Shortly after drinking it, she had a severe headache that was worst at onset associated with nausea vomiting.  This eased off, and she subsequently had a mild headache that persisted throughout the night and into tomorrow.  She has since had three recurrent thunderclap headaches, the most recent was last night.  She denies any SSRI use.  She does admit to nightly marijuana which helps treat her restless leg syndrome, also drinks 1 cup of coffee a day.    ROS: A 14 point ROS was performed and is negative except as noted in the HPI.  Past Medical History:  Diagnosis Date   Acid reflux    Diabetes mellitus without complication (HCC)    Restless leg      Family History  Problem Relation Age of Onset   Lung cancer Mother    Diabetes Mother      Social History:  reports that she has quit smoking. She has never used smokeless tobacco. She reports that she does not drink alcohol and does not use drugs.   Exam: Current vital signs: BP (!) 151/88   Pulse 70   Temp 99.1 F (37.3 C) (Oral)   Resp 15   Ht 5\' 4"  (1.626 m)   Wt 67.1 kg   SpO2 97%   BMI 25.40 kg/m  Vital signs in last 24 hours: Temp:  [99.1 F (37.3 C)] 99.1 F (37.3 C) (05/19 0537) Pulse Rate:  [70-90] 70 (05/19 1015) Resp:  [15-16] 15 (05/19 0648) BP: (150-189)/(82-90) 151/88 (05/19 0700) SpO2:  [97 %-99 %] 97 % (05/19 1015) Weight:  [67.1 kg] 67.1 kg (05/19 0537)   Physical Exam  Constitutional: Appears well-developed and well-nourished.  Psych: Affect appropriate to situation Eyes: No scleral injection HENT: No OP  obstruction MSK: no joint deformities.  Cardiovascular: Normal rate and regular rhythm.  Respiratory: Effort normal, non-labored breathing GI: Soft.  No distension. There is no tenderness.  Skin: WDI  Neuro: Mental Status: Patient is awake, alert, oriented to person, place, month, year, and situation. Patient is able to give a clear and coherent history. No signs of aphasia or neglect Cranial Nerves: II: Visual Fields are full. Pupils are equal, round, and reactive to light.   III,IV, VI: EOMI without ptosis or diploplia.  V: Facial sensation is symmetric to temperature VII: Facial movement is symmetric.  VIII: hearing is intact to voice X: Uvula elevates symmetrically XI: Shoulder shrug is symmetric. XII: tongue is midline without atrophy or fasciculations.  Motor: Tone is normal. Bulk is normal. 5/5 strength was present in all four extremities.  Sensory: Sensation is symmetric to light touch and temperature in the arms and legs. 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  She has no neurological symptoms to suggest stroke or seizures.  She has been having symptoms for a  week at this point, and I think it would be reasonable to start her on a calcium channel blocker to try and reduce the frequency and intensity of the headaches.  I advised her to stop caffeine consumption completely, as well as stopping marijuana consumption.  I advised complete cessation of these, but if she were to restart to do so very cautiously.  With symptoms for a week, I am not certain that she would benefit from inpatient observation, but I did advise her to return emergently for  any new onset of neurological symptoms.  Recommendations: 1) verapamil 5 mg IV x1 while in a monitored setting. 2) could start verapamil 40 mg 3 times daily to try and lower frequency and intensity of headaches 3) return to care emergently for any focal neurological deficits. 4) could try tramadol 50mg  QHS for RLS since no longer able to use marijuana.   , MD [facility] [phone]  If 7pm- 7am, please page neurology on call as listed in AMION.

## 2022-02-10 NOTE — ED Triage Notes (Signed)
Pt to ED from home c/o headache that started last night.  States had carotid US done this past Wednesday and told some blockages to bilateral carotids, and if headaches return to come to ED.  Has follow up with cardiologist on Monday.  Pain starts posterior and radiates to front of head.  Pt A&Ox4, chest rise even and unlabored, skin WNL and in NAD at this time.

## 2022-02-13 ENCOUNTER — Encounter: Payer: Self-pay | Admitting: Cardiology

## 2022-02-13 ENCOUNTER — Other Ambulatory Visit
Admission: RE | Admit: 2022-02-13 | Discharge: 2022-02-13 | Disposition: A | Discharge status: Home / Self Care | Payer: BC Managed Care – PPO | Attending: Cardiology | Admitting: Cardiology

## 2022-02-13 ENCOUNTER — Ambulatory Visit: Payer: BC Managed Care – PPO | Admitting: Cardiology

## 2022-02-13 VITALS — BP 130/82 | HR 74 | Ht 64.0 in | Wt 147.0 lb

## 2022-02-13 DIAGNOSIS — I2510 Atherosclerotic heart disease of native coronary artery without angina pectoris: Secondary | ICD-10-CM | POA: Diagnosis not present

## 2022-02-13 DIAGNOSIS — E782 Mixed hyperlipidemia: Secondary | ICD-10-CM

## 2022-02-13 DIAGNOSIS — I6523 Occlusion and stenosis of bilateral carotid arteries: Secondary | ICD-10-CM

## 2022-02-13 LAB — LIPID PANEL
Cholesterol: 257 mg/dL — ABNORMAL HIGH (ref 0–200)
HDL: 47 mg/dL (ref >40)
LDL Cholesterol: 189 mg/dL — ABNORMAL HIGH (ref 0–99)
Total CHOL/HDL Ratio: 5.5 RATIO
Triglycerides: 107 mg/dL (ref <150)
VLDL: 21 mg/dL (ref 0–40)

## 2022-02-13 MED ORDER — ATORVASTATIN CALCIUM 40 MG PO TABS: 40.0000 mg | ORAL_TABLET | Freq: Every day | ORAL | 5 refills | Status: DC

## 2022-02-13 NOTE — Patient Instructions (Signed)
Medication Instructions:   Your physician has recommended you make the following change in your medication:     START taking Atorvastatin 40 MG once a day.  *If you need a refill on your cardiac medications before your next appointment, please call your pharmacy*   Lab Work:  Please go to the medical mall after your appointment for a fasting Lipid Panel.   Testing/Procedures: None ordered   Follow-Up: At [facility], you and your health needs are our priority.  As part of our continuing mission to provide you with exceptional heart care, we have created designated Provider Care Teams.  These Care Teams include your primary Cardiologist (physician) and Advanced Practice Providers (APPs -  Physician Assistants and Nurse Practitioners) who all work together to provide you with the care you need, when you need it.  We recommend signing up for the patient portal called "MyChart".  Sign up information is provided on this After Visit Summary.  MyChart is used to connect with patients for Virtual Visits (Telemedicine).  Patients are able to view lab/test results, encounter notes, upcoming appointments, etc.  Non-urgent messages can be sent to your provider as well.   To learn more about what you can do with MyChart, go to [email or web address].    Your next appointment:   2-3  month(s)  The format for your next appointment:   In Person  Provider:   You may see Debbe Odea, MD or one of the following Advanced Practice Providers on your designated Care Team:   Nicolasa Ducking, NP Eula Listen, PA-C Cadence Fransico Michael, [place]    Other Instructions   Important Information About Sugar

## 2022-02-13 NOTE — Progress Notes (Signed)
Cardiology Office Note:    Date:  02/13/2022   ID:  Kimberly Warner, DOB [birth year], MRN [id number]  PCP:  [facility] [place]  Cardiologist:  Kate Sable, MD  Electrophysiologist:  None   Referring MD: [place]*   Chief Complaint  Patient presents with   Follow-up    F/U after carotid U/S    History of Present Illness:    Kimberly Warner is a 46 y.o. female with a hx CAD ([place] 12/2019- occ mid LAD w/Left to left collaterals, prox RCA 40%), hyperlipidemia diabetes, former smoker x 20years who presents due to abnormal carotid ultrasound.  Recently seen in the hospital due to headaches after drinking a Starbucks for approaching.  Evaluated by neurology, diagnosed with cerebral vasospasm, started on verapamil for this.  Neck CT showed less than 50% stenosis in the right carotid, less than 50% in the left carotid artery.  Carotid ultrasound showed moderate stenosis in the right internal carotid artery, mild stenosis in the left.  Severe stenosis in the external carotid artery is noted.  She has not taken Lipitor for over a year now.  States taking aspirin.  Denies chest pain.  Has on and off headaches.  Prior notes Echocardiogram 10/2020 EF 60 to 65%, impaired relaxation. she has a significant family history of MI in both parents in their 30s.  Myoview 11/2019 reversible perfusion defect in the LV apical anterior and apex. Left heart cath 12/2019 chronically occluded mid LAD with left to left collaterals.  Proximal RCA 40% stenosed.  EF on LV gram normal by visual estimate   Past Medical History:  Diagnosis Date   Acid reflux    Diabetes mellitus without complication (HCC)    Restless leg     Past Surgical History:  Procedure Laterality Date   ABDOMINAL HYSTERECTOMY     CARDIAC CATHETERIZATION     LEFT HEART CATH AND CORONARY ANGIOGRAPHY N/A 01/02/2020   Procedure: LEFT HEART CATH AND CORONARY ANGIOGRAPHY;  Surgeon: Wellington Hampshire, MD;   Location: [facility];  Service: Cardiovascular;  Laterality: N/A;    Current Medications: Current Meds  Medication Sig   aspirin EC 81 MG tablet Take 81 mg by mouth daily.   esomeprazole (NEXIUM) 10 MG packet Take 10 mg by mouth daily before breakfast.   gabapentin (NEURONTIN) 300 MG capsule Take 300-900 mg by mouth See admin instructions. 300 mg at 1300, 900 mg at bedtime   verapamil (CALAN) 40 MG tablet Take 1 tablet (40 mg total) by mouth 3 (three) times daily for 7 days.   [DISCONTINUED] atorvastatin (LIPITOR) 40 MG tablet Take 1 tablet (40 mg total) by mouth daily at 6 PM.     Allergies:   Celebrex [celecoxib], Nsaids, Sulfa antibiotics, and Penicillins   Social History   Socioeconomic History   Marital status: Married    Spouse name: Not on file   Number of children: Not on file   Years of education: Not on file   Highest education level: Not on file  Occupational History   Not on file  Tobacco Use   Smoking status: Former   Smokeless tobacco: Never  Vaping Use   Vaping Use: Never used  Substance and Sexual Activity   Alcohol use: No   Drug use: No   Sexual activity: Not on file  Other Topics Concern   Not on file  Social History Narrative   Not on file   Social Determinants of Health  Financial Resource Strain: Not on file  Food Insecurity: Not on file  Transportation Needs: Not on file  Physical Activity: Not on file  Stress: Not on file  Social Connections: Not on file     Family History: The patient's family history includes Alcoholism in her father; COPD in her father; Diabetes in her mother; Heart failure in her father; Lung cancer in her mother; Stroke in her father.  ROS:   Please see the history of present illness.     All other systems reviewed and are negative.  EKGs/Labs/Other Studies Reviewed:    The following studies were reviewed today:   EKG:  EKG is  ordered today.  The ekg ordered today demonstrates normal sinus rhythm, low  voltage QRS.  Recent Labs: 02/10/2022: ALT 13; BUN 13; Creatinine, Ser 0.61; Hemoglobin 14.5; Platelets 286; Potassium 3.8; Sodium 140  Recent Lipid Panel    Component Value Date/Time   CHOL 235 (H) 12/30/2019 0800   TRIG 187 (H) 12/30/2019 0800   HDL 39 (L) 12/30/2019 0800   CHOLHDL 6.0 12/30/2019 0800   VLDL 37 12/30/2019 0800   LDLCALC 159 (H) 12/30/2019 0800    Physical Exam:    VS:  BP 130/82 (BP Location: Left Arm, Patient Position: Sitting, Cuff Size: Normal)   Pulse 74   Ht 5\' 4"  (1.626 m)   Wt 147 lb (66.7 kg)   SpO2 98%   BMI 25.23 kg/m     Wt Readings from Last 3 Encounters:  02/13/22 147 lb (66.7 kg)  02/10/22 148 lb (67.1 kg)  08/08/21 152 lb (68.9 kg)     GEN:  Well nourished, well developed in no acute distress HEENT: Normal NECK: No JVD; bilateral carotid bruit noted LYMPHATICS: No lymphadenopathy CARDIAC: RRR, no murmur RESPIRATORY:  Clear to auscultation without rales, wheezing or rhonchi  ABDOMEN: Soft, non-tender, non-distended MUSCULOSKELETAL:  No edema; No deformity  SKIN: Warm and dry NEUROLOGIC:  Alert and oriented x 3 PSYCHIATRIC:  Normal affect   ASSESSMENT:    1. Coronary artery disease involving native coronary artery of native heart, unspecified whether angina present   2. Mixed hyperlipidemia   3. Bilateral carotid artery stenosis      PLAN:    In order of problems listed above:  CAD/chronically occluded mid LAD, 40%RCA. continue aspirin 81 mg, Lipitor 40.  Denies chest pain.  Prior echo shows normal systolic function EF 60 to 65%, impaired relaxation.  Medication compliance advised. Hyperlipidemia, goal LDL less than 70.  Not compliant with Lipitor as prescribed, compliance encouraged.  Start Lipitor 40 mg daily Carotid artery stenosis, moderate on the right (50-69%), mild on the left.  Continue aspirin 81 mg, Lipitor 40 mg.  Refer to vascular surgery.  Follow-up 3 months.   This note was generated in part or whole with voice  recognition software. Voice recognition is usually quite accurate but there are transcription errors that can and very often do occur. I apologize for any typographical errors that were not detected and corrected.  Medication Adjustments/Labs and Tests Ordered: Current medicines are reviewed at length with the patient today.  Concerns regarding medicines are outlined above.  Orders Placed This Encounter  Procedures   Lipid panel   Ambulatory referral to Vascular Surgery   EKG 12-Lead    Meds ordered this encounter  Medications   atorvastatin (LIPITOR) 40 MG tablet    Sig: Take 1 tablet (40 mg total) by mouth daily at 6 PM.    Dispense:  30 tablet    Refill:  5     Patient Instructions  Medication Instructions:   Your physician has recommended you make the following change in your medication:     START taking Atorvastatin 40 MG once a day.  *If you need a refill on your cardiac medications before your next appointment, please call your pharmacy*   Lab Work:  Please go to the medical mall after your appointment for a fasting Lipid Panel.   Testing/Procedures: None ordered   Follow-Up: At [facility], you and your health needs are our priority.  As part of our continuing mission to provide you with exceptional heart care, we have created designated Provider Care Teams.  These Care Teams include your primary Cardiologist (physician) and Advanced Practice Providers (APPs -  Physician Assistants and Nurse Practitioners) who all work together to provide you with the care you need, when you need it.  We recommend signing up for the patient portal called "MyChart".  Sign up information is provided on this After Visit Summary.  MyChart is used to connect with patients for Virtual Visits (Telemedicine).  Patients are able to view lab/test results, encounter notes, upcoming appointments, etc.  Non-urgent messages can be sent to your provider as well.   To learn more about what you  can do with MyChart, go to [email or web address].    Your next appointment:   2-3  month(s)  The format for your next appointment:   In Person  Provider:   You may see Kate Sable, MD or one of the following Advanced Practice Providers on your designated Care Team:   Murray Hodgkins, NP Christell Faith, PA-C Cadence Kathlen Mody, [place]    Other Instructions   Important Information About Sugar         Signed, Kate Sable, MD  02/13/2022 9:36 AM    [place]

## 2022-02-14 ENCOUNTER — Encounter: Payer: Self-pay | Admitting: Cardiology

## 2022-02-14 ENCOUNTER — Telehealth: Payer: Self-pay

## 2022-02-14 DIAGNOSIS — E782 Mixed hyperlipidemia: Secondary | ICD-10-CM

## 2022-02-14 NOTE — Telephone Encounter (Signed)
-----   Message from Kate Sable, MD sent at 02/14/2022  7:58 AM EDT ----- Cholesterol elevated, take Lipitor as prescribed.  Repeat fasting lipid profile in 3 months prior to follow-up visit.

## 2022-02-14 NOTE — Telephone Encounter (Signed)
Called patient and informed her of the result note below. Patient confirmed that she will get a fasting lipid drawn at the medical mall prior to her follow up appointment in 3 months.

## 2022-02-15 ENCOUNTER — Other Ambulatory Visit: Payer: Self-pay

## 2022-02-15 MED ORDER — ATORVASTATIN CALCIUM 40 MG PO TABS
40.0000 mg | ORAL_TABLET | Freq: Every day | ORAL | 5 refills | Status: DC
Start: 2022-02-15 — End: 2022-02-21

## 2022-02-21 ENCOUNTER — Other Ambulatory Visit: Payer: Self-pay

## 2022-02-21 MED ORDER — ATORVASTATIN CALCIUM 40 MG PO TABS: 40.0000 mg | ORAL_TABLET | Freq: Every day | ORAL | 5 refills | Status: AC

## 2022-03-30 ENCOUNTER — Other Ambulatory Visit (INDEPENDENT_AMBULATORY_CARE_PROVIDER_SITE_OTHER): Payer: Self-pay | Admitting: Cardiology

## 2022-03-30 DIAGNOSIS — I779 Disorder of arteries and arterioles, unspecified: Secondary | ICD-10-CM

## 2022-03-31 ENCOUNTER — Encounter (INDEPENDENT_AMBULATORY_CARE_PROVIDER_SITE_OTHER): Payer: Self-pay | Admitting: Nurse Practitioner

## 2022-03-31 ENCOUNTER — Ambulatory Visit (INDEPENDENT_AMBULATORY_CARE_PROVIDER_SITE_OTHER): Payer: BC Managed Care – PPO | Admitting: Nurse Practitioner

## 2022-03-31 ENCOUNTER — Ambulatory Visit (INDEPENDENT_AMBULATORY_CARE_PROVIDER_SITE_OTHER): Payer: BC Managed Care – PPO

## 2022-03-31 VITALS — BP 122/76 | HR 80 | Resp 16 | Wt 146.8 lb

## 2022-03-31 DIAGNOSIS — I6523 Occlusion and stenosis of bilateral carotid arteries: Secondary | ICD-10-CM

## 2022-03-31 DIAGNOSIS — I779 Disorder of arteries and arterioles, unspecified: Secondary | ICD-10-CM

## 2022-04-01 ENCOUNTER — Encounter (INDEPENDENT_AMBULATORY_CARE_PROVIDER_SITE_OTHER): Payer: Self-pay | Admitting: Nurse Practitioner

## 2022-04-01 NOTE — Progress Notes (Signed)
Subjective:    Patient ID: Kimberly Warner, female    DOB: Feb 22, 1976, 46 y.o.   MRN: [id number] Chief Complaint  Patient presents with   New Patient (Initial Visit)    Ref Agbor-Etang consult carotid stenosis    Nasiah Polinsky is a 46 year old female who presents her office today for evaluation of carotid artery stenosis.  It was first detected by her cardiologist in the setting of headaches.  However it was determined that the patient's headaches were a result of reversible vasoconstriction syndrome.  Since that time patient's not had any issues currently.  She denies any TIA or CVA-like symptoms.  No amaurosis fugax.  Today noninvasive studies show 9% stenosis bilateral internal carotid arteries.  The bilateral vertebral arteries have antegrade flow with normal flow hemodynamics in the bilateral subclavian arteries.    Review of Systems  Neurological:  Positive for headaches.  All other systems reviewed and are negative.      Objective:   Physical Exam Vitals reviewed.  HENT:     Head: Normocephalic.  Cardiovascular:     Rate and Rhythm: Normal rate and regular rhythm.     Pulses: Normal pulses.  Pulmonary:     Effort: Pulmonary effort is normal.  Skin:    General: Skin is warm and dry.  Neurological:     Mental Status: She is alert and oriented to person, place, and time.  Psychiatric:        Mood and Affect: Mood normal.        Behavior: Behavior normal.        Thought Content: Thought content normal.        Judgment: Judgment normal.     BP 122/76 (BP Location: Right Arm)   Pulse 80   Resp 16   Wt 146 lb 12.8 oz (66.6 kg)   BMI 25.20 kg/m   Past Medical History:  Diagnosis Date   Acid reflux    Diabetes mellitus without complication (HCC)    Restless leg     Social History   Socioeconomic History   Marital status: Married    Spouse name: Not on file   Number of children: Not on file   Years of education: Not on file   Highest education level: Not on  file  Occupational History   Not on file  Tobacco Use   Smoking status: Former   Smokeless tobacco: Never  Vaping Use   Vaping Use: Never used  Substance and Sexual Activity   Alcohol use: No   Drug use: No   Sexual activity: Not on file  Other Topics Concern   Not on file  Social History Narrative   Not on file   Social Determinants of Health   Financial Resource Strain: Not on file  Food Insecurity: Not on file  Transportation Needs: Not on file  Physical Activity: Not on file  Stress: Not on file  Social Connections: Not on file  Intimate Partner Violence: Not on file    Past Surgical History:  Procedure Laterality Date   ABDOMINAL HYSTERECTOMY     CARDIAC CATHETERIZATION     LEFT HEART CATH AND CORONARY ANGIOGRAPHY N/A 01/02/2020   Procedure: LEFT HEART CATH AND CORONARY ANGIOGRAPHY;  Surgeon: Iran Ouch, MD;  Location: [facility];  Service: Cardiovascular;  Laterality: N/A;    Family History  Problem Relation Age of Onset   Lung cancer Mother    Diabetes Mother    Heart failure Father  Alcoholism Father    COPD Father    Stroke Father     Allergies  Allergen Reactions   Celebrex [Celecoxib] Anaphylaxis   Nsaids     Burns stomach    Sulfa Antibiotics Nausea And Vomiting   Penicillins Rash    Did it involve swelling of the face/tongue/throat, SOB, or low BP? No Did it involve sudden or severe rash/hives, skin peeling, or any reaction on the inside of your mouth or nose? Yes Did you need to seek medical attention at a hospital or doctor's office? Yes When did it last happen?      childhood If all above answers are "NO", may proceed with cephalosporin use.        Latest Ref Rng & Units 02/10/2022    6:19 AM 12/30/2019    8:00 AM 06/02/2015    3:25 PM  CBC  WBC 4.0 - 10.5 K/uL 5.8  6.0  13.8   Hemoglobin 12.0 - 15.0 g/dL 51.0  25.8  52.7   Hematocrit 36.0 - 46.0 % 42.2  38.5  45.3   Platelets 150 - 400 K/uL 286  288  312        CMP     Component Value Date/Time   NA 140 02/10/2022 0619   K 3.8 02/10/2022 0619   CL 101 02/10/2022 0619   CO2 28 02/10/2022 0619   GLUCOSE 109 (H) 02/10/2022 0619   BUN 13 02/10/2022 0619   CREATININE 0.61 02/10/2022 0619   CALCIUM 9.5 02/10/2022 0619   PROT 7.6 02/10/2022 0619   ALBUMIN 4.2 02/10/2022 0619   AST 20 02/10/2022 0619   ALT 13 02/10/2022 0619   ALKPHOS 60 02/10/2022 0619   BILITOT 0.6 02/10/2022 0619   GFRNONAA >60 02/10/2022 0619   GFRAA >60 12/30/2019 0800     No results found.     Assessment & Plan:   1. Bilateral carotid artery stenosis Recommend:  Given the patient's asymptomatic subcritical stenosis no further invasive testing or surgery at this time.  Duplex ultrasound shows <50% stenosis of internal carotid artery bilaterally.  The patient does have more elevated velocities in the external carotid artery, currently there is no role for intervention due to her internal carotid arteries being widely patent.  Continue asat therapy as prescribed Continue management of CAD, and Hyperlipidemia Healthy heart diet,  encouraged exercise at least 4 times per week Follow up in 24 months with duplex ultrasound and physical exam     Current Outpatient Medications on File Prior to Visit  Medication Sig Dispense Refill   aspirin EC 81 MG tablet Take 81 mg by mouth daily.     atorvastatin (LIPITOR) 40 MG tablet Take 1 tablet (40 mg total) by mouth daily. 30 tablet 5   esomeprazole (NEXIUM) 10 MG packet Take 10 mg by mouth daily before breakfast.     nitroGLYCERIN (NITROSTAT) 0.4 MG SL tablet Place 1 tablet (0.4 mg total) under the tongue every 5 (five) minutes as needed for chest pain. 25 tablet 3   pregabalin (LYRICA) 75 MG capsule Take 75 mg by mouth 2 (two) times daily.     gabapentin (NEURONTIN) 300 MG capsule Take 300-900 mg by mouth See admin instructions. 300 mg at 1300, 900 mg at bedtime (Patient not taking: Reported on 03/31/2022)      verapamil (CALAN) 40 MG tablet Take 1 tablet (40 mg total) by mouth 3 (three) times daily for 7 days. 21 tablet 0   No current facility-administered medications  on file prior to visit.    There are no Patient Instructions on file for this visit. No follow-ups on file.   Kris Hartmann, NP

## 2022-04-25 ENCOUNTER — Ambulatory Visit: Payer: BC Managed Care – PPO | Admitting: Cardiology

## 2022-05-17 ENCOUNTER — Ambulatory Visit: Payer: BC Managed Care – PPO | Admitting: Medical

## 2022-06-20 ENCOUNTER — Ambulatory Visit: Payer: BC Managed Care – PPO | Attending: Cardiology | Admitting: Medical

## 2022-06-20 NOTE — Progress Notes (Deleted)
Cardiology Office Note:    Date:  06/20/2022   ID:  Kimberly Warner, DOB 04-01-1976, MRN [id number]  PCP:  [facility]  [facility] Cardiologist:  Debbe Odea, MD  [facility] Electrophysiologist:  None   Referring MD: [facility] [place] [facility]   Chief Complaint: 3 month follow-up  History of Present Illness:    Kimberly Warner is a 46 y.o. female with a hx of CAD (LHC 12/2019- occ mid LAD w/Left to left collaterals, prox RCA 40%), hyperlipidemia diabetes, former smoker x 20years who presents due to abnormal carotid ultrasound.  Echocardiogram 10/2020 EF 60 to 65%, impaired relaxation, she has a significant family history of MI in both parents in their 97s. Myoview 11/2019 reversible perfusion defect in the LV apical anterior and apex. Left heart cath 12/2019 chronically occluded mid LAD with left to left collaterals, proximal RCA 40% stenosed, EF on LV gram normal by visual estimate.   Seen in the hospital 02/10/22 due to headaches after drinking a [employer] for approaching.  Evaluated by neurology, diagnosed with cerebral vasospasm, started on verapamil for this.  Neck CT showed less than 50% stenosis in the right carotid, less than 50% in the left carotid artery.  Carotid ultrasound showed moderate stenosis in the right internal carotid artery, mild stenosis in the left.  Severe stenosis in the external carotid artery is noted.  She was last seen 02/13/22 and was referred to vascular for carotid stenosis.   Today,   Past Medical History:  Diagnosis Date   Acid reflux    Diabetes mellitus without complication (HCC)    Restless leg     Past Surgical History:  Procedure Laterality Date   ABDOMINAL HYSTERECTOMY     CARDIAC CATHETERIZATION     LEFT HEART CATH AND CORONARY ANGIOGRAPHY N/A 01/02/2020   Procedure: LEFT HEART CATH AND CORONARY ANGIOGRAPHY;  Surgeon: Iran Ouch, MD;  Location: [facility];  Service: Cardiovascular;   Laterality: N/A;    Current Medications: No outpatient medications have been marked as taking for the 06/20/22 encounter (Appointment) with Fransico Michael, Sho Salguero H, PA-C.     Allergies:   Celebrex [celecoxib], Nsaids, Sulfa antibiotics, and Penicillins   Social History   Socioeconomic History   Marital status: Married    Spouse name: Not on file   Number of children: Not on file   Years of education: Not on file   Highest education level: Not on file  Occupational History   Not on file  Tobacco Use   Smoking status: Former   Smokeless tobacco: Never  Vaping Use   Vaping Use: Never used  Substance and Sexual Activity   Alcohol use: No   Drug use: No   Sexual activity: Not on file  Other Topics Concern   Not on file  Social History Narrative   Not on file   Social Determinants of Health   Financial Resource Strain: Not on file  Food Insecurity: Not on file  Transportation Needs: Not on file  Physical Activity: Not on file  Stress: Not on file  Social Connections: Not on file     Family History: The patient's family history includes Alcoholism in her father; COPD in her father; Diabetes in her mother; Heart failure in her father; Lung cancer in her mother; Stroke in her father.  ROS:   Please see the history of present illness.     All other systems reviewed and are negative.  EKGs/Labs/Other Studies Reviewed:  The following studies were reviewed today: ***  EKG:  EKG is *** ordered today.  The ekg ordered today demonstrates ***  Recent Labs: 02/10/2022: ALT 13; BUN 13; Creatinine, Ser 0.61; Hemoglobin 14.5; Platelets 286; Potassium 3.8; Sodium 140  Recent Lipid Panel    Component Value Date/Time   CHOL 257 (H) 02/13/2022 0950   TRIG 107 02/13/2022 0950   HDL 47 02/13/2022 0950   CHOLHDL 5.5 02/13/2022 0950   VLDL 21 02/13/2022 0950   LDLCALC 189 (H) 02/13/2022 0950     Risk Assessment/Calculations:   {Does this patient have ATRIAL  FIBRILLATION?:[id number]}   Physical Exam:    VS:  There were no vitals taken for this visit.    Wt Readings from Last 3 Encounters:  03/31/22 146 lb 12.8 oz (66.6 kg)  02/13/22 147 lb (66.7 kg)  02/10/22 148 lb (67.1 kg)     GEN: *** Well nourished, well developed in no acute distress HEENT: Normal NECK: No JVD; No carotid bruits LYMPHATICS: No lymphadenopathy CARDIAC: ***RRR, no murmurs, rubs, gallops RESPIRATORY:  Clear to auscultation without rales, wheezing or rhonchi  ABDOMEN: Soft, non-tender, non-distended MUSCULOSKELETAL:  No edema; No deformity  SKIN: Warm and dry NEUROLOGIC:  Alert and oriented x 3 PSYCHIATRIC:  Normal affect   ASSESSMENT:    No diagnosis found. PLAN:    In order of problems listed above:  ***  Disposition: Follow up {follow up:15908} with ***   Shared Decision Making/Informed Consent   {Are you ordering a CV Procedure (e.g. stress test, cath, DCCV, TEE, etc)?   Press F2        :[id number]}    Signed, Morenike Cuff Arlyss Repress  06/20/2022 8:19 AM    [facility]

## 2022-07-24 ENCOUNTER — Encounter (INDEPENDENT_AMBULATORY_CARE_PROVIDER_SITE_OTHER): Payer: Self-pay

## 2024-02-12 ENCOUNTER — Encounter (INDEPENDENT_AMBULATORY_CARE_PROVIDER_SITE_OTHER): Payer: Self-pay

## 2024-03-31 ENCOUNTER — Other Ambulatory Visit (INDEPENDENT_AMBULATORY_CARE_PROVIDER_SITE_OTHER): Payer: Self-pay | Admitting: Nurse Practitioner

## 2024-03-31 DIAGNOSIS — I6523 Occlusion and stenosis of bilateral carotid arteries: Secondary | ICD-10-CM

## 2024-04-01 ENCOUNTER — Encounter (INDEPENDENT_AMBULATORY_CARE_PROVIDER_SITE_OTHER): Payer: BC Managed Care – PPO

## 2024-04-01 ENCOUNTER — Ambulatory Visit (INDEPENDENT_AMBULATORY_CARE_PROVIDER_SITE_OTHER): Payer: BC Managed Care – PPO | Admitting: Vascular Surgery

## 2024-05-06 ENCOUNTER — Encounter (INDEPENDENT_AMBULATORY_CARE_PROVIDER_SITE_OTHER): Payer: Self-pay | Admitting: Vascular Surgery

## 2024-05-06 ENCOUNTER — Other Ambulatory Visit (INDEPENDENT_AMBULATORY_CARE_PROVIDER_SITE_OTHER)

## 2024-05-06 ENCOUNTER — Ambulatory Visit (INDEPENDENT_AMBULATORY_CARE_PROVIDER_SITE_OTHER): Admitting: Vascular Surgery

## 2024-05-06 VITALS — BP 112/76 | HR 86 | Ht 64.0 in | Wt 165.0 lb

## 2024-05-06 DIAGNOSIS — E785 Hyperlipidemia, unspecified: Secondary | ICD-10-CM | POA: Diagnosis not present

## 2024-05-06 DIAGNOSIS — I6529 Occlusion and stenosis of unspecified carotid artery: Secondary | ICD-10-CM | POA: Insufficient documentation

## 2024-05-06 DIAGNOSIS — E119 Type 2 diabetes mellitus without complications: Secondary | ICD-10-CM | POA: Insufficient documentation

## 2024-05-06 DIAGNOSIS — I6523 Occlusion and stenosis of bilateral carotid arteries: Secondary | ICD-10-CM

## 2024-05-06 NOTE — Assessment & Plan Note (Signed)
 Carotid duplex today shows progression bilaterally into the 40-59% range bilaterally.  These were in the 1-39% range two years ago.  Continue aspirin  and Lipitor. Shorten follow up to annually.

## 2024-05-06 NOTE — Assessment & Plan Note (Signed)
 blood glucose control important in reducing the progression of atherosclerotic disease. Also, involved in wound healing. On appropriate medications.

## 2024-05-06 NOTE — Assessment & Plan Note (Signed)
 lipid control important in reducing the progression of atherosclerotic disease. Continue statin therapy

## 2024-05-06 NOTE — Progress Notes (Signed)
 MRN : [id number]  Kimberly Warner is a 48 y.o. (January 14, 1976) female who presents with chief complaint of  Chief Complaint  Patient presents with   Follow-up    2 year follow up   .  History of Present Illness: Patient returns today in follow up of her carotid disease.  She has not had any focal neurologic symptoms.  Specifically, the patient denies amaurosis fugax, speech or swallowing difficulties, or arm or leg weakness or numbness. Carotid duplex today shows progression bilaterally into the 40-59% range bilaterally.  These were in the 1-39% range two years ago.   Current Outpatient Medications  Medication Sig Dispense Refill   aspirin  EC 81 MG tablet Take 81 mg by mouth daily.     atorvastatin  (LIPITOR) 40 MG tablet Take 1 tablet (40 mg total) by mouth daily. 30 tablet 5   esomeprazole (NEXIUM) 10 MG packet Take 10 mg by mouth daily before breakfast.     nitroGLYCERIN  (NITROSTAT ) 0.4 MG SL tablet Place 1 tablet (0.4 mg total) under the tongue every 5 (five) minutes as needed for chest pain. 25 tablet 3   pregabalin (LYRICA) 75 MG capsule Take 75 mg by mouth 2 (two) times daily.     verapamil  (CALAN ) 40 MG tablet Take 1 tablet (40 mg total) by mouth 3 (three) times daily for 7 days. 21 tablet 0   gabapentin (NEURONTIN) 300 MG capsule Take 300-900 mg by mouth See admin instructions. 300 mg at 1300, 900 mg at bedtime (Patient not taking: Reported on 05/06/2024)     No current facility-administered medications for this visit.    Past Medical History:  Diagnosis Date   Acid reflux    Diabetes mellitus without complication (HCC)    Restless leg     Past Surgical History:  Procedure Laterality Date   ABDOMINAL HYSTERECTOMY     CARDIAC CATHETERIZATION     LEFT HEART CATH AND CORONARY ANGIOGRAPHY N/A 01/02/2020   Procedure: LEFT HEART CATH AND CORONARY ANGIOGRAPHY;  Surgeon: Darron Deatrice LABOR, MD;  Location: [facility];  Service: Cardiovascular;  Laterality: N/A;     Social  History   Tobacco Use   Smoking status: Former   Smokeless tobacco: Never  Vaping Use   Vaping status: Every Day  Substance Use Topics   Alcohol use: No   Drug use: No      Family History  Problem Relation Age of Onset   Lung cancer Mother    Diabetes Mother    Heart failure Father    Alcoholism Father    COPD Father    Stroke Father      Allergies  Allergen Reactions   Celebrex [Celecoxib] Anaphylaxis   Nsaids     Burns stomach    Sulfa Antibiotics Nausea And Vomiting   Penicillins Rash    Did it involve swelling of the face/tongue/throat, SOB, or low BP? No Did it involve sudden or severe rash/hives, skin peeling, or any reaction on the inside of your mouth or nose? Yes Did you need to seek medical attention at a hospital or doctor's office? Yes When did it last happen?      childhood If all above answers are "NO", may proceed with cephalosporin use.      REVIEW OF SYSTEMS (Negative unless checked)  Constitutional: [] Weight loss  [] Fever  [] Chills Cardiac: [] Chest pain   [] Chest pressure   [] Palpitations   [] Shortness of breath when laying flat   [] Shortness of breath  at rest   [] Shortness of breath with exertion. Vascular:  [] Pain in legs with walking   [] Pain in legs at rest   [] Pain in legs when laying flat   [] Claudication   [] Pain in feet when walking  [] Pain in feet at rest  [] Pain in feet when laying flat   [] History of DVT   [] Phlebitis   [] Swelling in legs   [] Varicose veins   [] Non-healing ulcers Pulmonary:   [] Uses home oxygen   [] Productive cough   [] Hemoptysis   [] Wheeze  [] COPD   [] Asthma Neurologic:  [] Dizziness  [] Blackouts   [] Seizures   [] History of stroke   [] History of TIA  [] Aphasia   [] Temporary blindness   [] Dysphagia   [] Weakness or numbness in arms   [] Weakness or numbness in legs Musculoskeletal:  [] Arthritis   [] Joint swelling   [] Joint pain   [] Low back pain Hematologic:  [] Easy bruising  [] Easy bleeding   [] Hypercoagulable state    [] Anemic   Gastrointestinal:  [] Blood in stool   [] Vomiting blood  [x] Gastroesophageal reflux/heartburn   [] Abdominal pain Genitourinary:  [] Chronic kidney disease   [] Difficult urination  [] Frequent urination  [] Burning with urination   [] Hematuria Skin:  [] Rashes   [] Ulcers   [] Wounds Psychological:  [] History of anxiety   []  History of major depression.  Physical Examination  BP 112/76   Pulse 86   Ht 5' 4 (1.626 m)   Wt 165 lb (74.8 kg)   BMI 28.32 kg/m  Gen:  WD/WN, NAD Head: [place]/AT, No temporalis wasting. Ear/Nose/Throat: Hearing grossly intact, nares w/o erythema or drainage Eyes: Conjunctiva clear. Sclera non-icteric Neck: Supple.  Trachea midline Pulmonary:  Good air movement, no use of accessory muscles.  Cardiac: RRR, no JVD Vascular:  Vessel Right Left  Radial Palpable Palpable           Musculoskeletal: M/S 5/5 throughout.  No deformity or atrophy. No edema. Neurologic: Sensation grossly intact in extremities.  Symmetrical.  Speech is fluent.  Psychiatric: Judgment intact, Mood & affect appropriate for pt's clinical situation. Dermatologic: No rashes or ulcers noted.  No cellulitis or open wounds.      Labs No results found for this or any previous visit (from the past 2160 hours).  Radiology No results found.  Assessment/Plan  Carotid stenosis Carotid duplex today shows progression bilaterally into the 40-59% range bilaterally.  These were in the 1-39% range two years ago.  Continue aspirin  and Lipitor. Shorten follow up to annually.   Diabetes mellitus without complication (HCC) blood glucose control important in reducing the progression of atherosclerotic disease. Also, involved in wound healing. On appropriate medications.   Hyperlipidemia lipid control important in reducing the progression of atherosclerotic disease. Continue statin therapy    Selinda Gu, MD  05/06/2024 3:54 PM    This note was created with Dragon medical transcription  system.  Any errors from dictation are purely unintentional

## 2025-05-05 ENCOUNTER — Ambulatory Visit (INDEPENDENT_AMBULATORY_CARE_PROVIDER_SITE_OTHER): Admitting: Vascular Surgery

## 2025-05-05 ENCOUNTER — Encounter (INDEPENDENT_AMBULATORY_CARE_PROVIDER_SITE_OTHER)
